# Patient Record
Sex: Female | Born: 1945 | Race: White | Hispanic: No | Marital: Married | Smoking: Never smoker
Health system: Southern US, Community
[De-identification: ages and names within clinical notes are randomized; demographics above are authoritative.]

## PROBLEM LIST (undated history)

## (undated) DIAGNOSIS — I10 Essential (primary) hypertension: Secondary | ICD-10-CM

## (undated) DIAGNOSIS — E119 Type 2 diabetes mellitus without complications: Secondary | ICD-10-CM

## (undated) DIAGNOSIS — C801 Malignant (primary) neoplasm, unspecified: Secondary | ICD-10-CM

## (undated) DIAGNOSIS — E7800 Pure hypercholesterolemia, unspecified: Secondary | ICD-10-CM

## (undated) DIAGNOSIS — R7303 Prediabetes: Secondary | ICD-10-CM

## (undated) HISTORY — PX: ABDOMINAL HYSTERECTOMY: SHX81

## (undated) HISTORY — PX: BREAST SURGERY: SHX581

## (undated) SURGERY — CYSTOSCOPY, WITH STENT INSERTION
Anesthesia: General | Laterality: Left

---

## 2006-11-14 DIAGNOSIS — K5730 Diverticulosis of large intestine without perforation or abscess without bleeding: Secondary | ICD-10-CM | POA: Insufficient documentation

## 2013-09-09 ENCOUNTER — Emergency Department (HOSPITAL_BASED_OUTPATIENT_CLINIC_OR_DEPARTMENT_OTHER)
Admission: EM | Admit: 2013-09-09 | Discharge: 2013-09-10 | Disposition: A | Discharge status: Home / Self Care | Payer: Medicare Other | Attending: Emergency Medicine | Admitting: Emergency Medicine

## 2013-09-09 ENCOUNTER — Encounter (HOSPITAL_BASED_OUTPATIENT_CLINIC_OR_DEPARTMENT_OTHER): Payer: Self-pay | Admitting: Emergency Medicine

## 2013-09-09 ENCOUNTER — Emergency Department (HOSPITAL_BASED_OUTPATIENT_CLINIC_OR_DEPARTMENT_OTHER): Payer: Medicare Other

## 2013-09-09 DIAGNOSIS — Z79899 Other long term (current) drug therapy: Secondary | ICD-10-CM | POA: Insufficient documentation

## 2013-09-09 DIAGNOSIS — Z90710 Acquired absence of both cervix and uterus: Secondary | ICD-10-CM | POA: Insufficient documentation

## 2013-09-09 DIAGNOSIS — I10 Essential (primary) hypertension: Secondary | ICD-10-CM | POA: Insufficient documentation

## 2013-09-09 DIAGNOSIS — R112 Nausea with vomiting, unspecified: Secondary | ICD-10-CM | POA: Diagnosis not present

## 2013-09-09 DIAGNOSIS — R1012 Left upper quadrant pain: Secondary | ICD-10-CM | POA: Insufficient documentation

## 2013-09-09 DIAGNOSIS — R509 Fever, unspecified: Secondary | ICD-10-CM | POA: Diagnosis not present

## 2013-09-09 DIAGNOSIS — E7800 Pure hypercholesterolemia, unspecified: Secondary | ICD-10-CM | POA: Insufficient documentation

## 2013-09-09 HISTORY — DX: Essential (primary) hypertension: I10

## 2013-09-09 HISTORY — DX: Pure hypercholesterolemia, unspecified: E78.00

## 2013-09-09 LAB — COMPREHENSIVE METABOLIC PANEL
ALT: 28 U/L (ref 0–35)
AST: 33 U/L (ref 0–37)
Albumin: 4.4 g/dL (ref 3.5–5.2)
Alkaline Phosphatase: 118 U/L — ABNORMAL HIGH (ref 39–117)
Anion gap: 21 — ABNORMAL HIGH (ref 5–15)
BUN: 19 mg/dL (ref 6–23)
CO2: 23 meq/L (ref 19–32)
CREATININE: 1 mg/dL (ref 0.50–1.10)
Calcium: 10.6 mg/dL — ABNORMAL HIGH (ref 8.4–10.5)
Chloride: 99 mEq/L (ref 96–112)
GFR calc Af Amer: 67 mL/min — ABNORMAL LOW (ref >90)
GFR, EST NON AFRICAN AMERICAN: 57 mL/min — AB (ref >90)
GLUCOSE: 194 mg/dL — AB (ref 70–99)
Potassium: 4.3 mEq/L (ref 3.7–5.3)
Sodium: 143 mEq/L (ref 137–147)
TOTAL PROTEIN: 8.1 g/dL (ref 6.0–8.3)
Total Bilirubin: 0.3 mg/dL (ref 0.3–1.2)

## 2013-09-09 LAB — LIPASE, BLOOD: LIPASE: 43 U/L (ref 11–59)

## 2013-09-09 LAB — URINALYSIS, ROUTINE W REFLEX MICROSCOPIC
Bilirubin Urine: NEGATIVE
Glucose, UA: NEGATIVE mg/dL
Hgb urine dipstick: NEGATIVE
KETONES UR: 15 mg/dL — AB
Leukocytes, UA: NEGATIVE
NITRITE: NEGATIVE
Protein, ur: 30 mg/dL — AB
Specific Gravity, Urine: 1.02 (ref 1.005–1.030)
UROBILINOGEN UA: 0.2 mg/dL (ref 0.0–1.0)
pH: 6 (ref 5.0–8.0)

## 2013-09-09 LAB — CBC WITH DIFFERENTIAL/PLATELET
BASOS ABS: 0 10*3/uL (ref 0.0–0.1)
Basophils Relative: 0 % (ref 0–1)
EOS PCT: 0 % (ref 0–5)
Eosinophils Absolute: 0 10*3/uL (ref 0.0–0.7)
HCT: 49.1 % — ABNORMAL HIGH (ref 36.0–46.0)
Hemoglobin: 16.5 g/dL — ABNORMAL HIGH (ref 12.0–15.0)
LYMPHS ABS: 1.1 10*3/uL (ref 0.7–4.0)
LYMPHS PCT: 6 % — AB (ref 12–46)
MCH: 28.7 pg (ref 26.0–34.0)
MCHC: 33.6 g/dL (ref 30.0–36.0)
MCV: 85.4 fL (ref 78.0–100.0)
MONO ABS: 1.4 10*3/uL — AB (ref 0.1–1.0)
Monocytes Relative: 8 % (ref 3–12)
Neutro Abs: 14.9 10*3/uL — ABNORMAL HIGH (ref 1.7–7.7)
Neutrophils Relative %: 86 % — ABNORMAL HIGH (ref 43–77)
Platelets: 337 10*3/uL (ref 150–400)
RBC: 5.75 MIL/uL — ABNORMAL HIGH (ref 3.87–5.11)
RDW: 14.9 % (ref 11.5–15.5)
WBC: 17.4 10*3/uL — AB (ref 4.0–10.5)

## 2013-09-09 LAB — URINE MICROSCOPIC-ADD ON

## 2013-09-09 MED ORDER — ONDANSETRON 4 MG PO TBDP
4.0000 mg | ORAL_TABLET | Freq: Once | ORAL | Status: AC
  Administered 2013-09-09: 4 mg via ORAL
  Filled 2013-09-09: qty 1

## 2013-09-09 MED ORDER — SODIUM CHLORIDE 0.9 % IV BOLUS (SEPSIS)
1000.0000 mL | Freq: Once | INTRAVENOUS | Status: AC
  Administered 2013-09-09: 1000 mL via INTRAVENOUS

## 2013-09-09 MED ORDER — MORPHINE SULFATE 4 MG/ML IJ SOLN
4.0000 mg | Freq: Once | INTRAMUSCULAR | Status: AC
  Administered 2013-09-09: 4 mg via INTRAVENOUS
  Filled 2013-09-09: qty 1

## 2013-09-09 MED ORDER — ONDANSETRON HCL 4 MG/2ML IJ SOLN: 4.0000 mg | Freq: Once | INTRAMUSCULAR | Status: DC

## 2013-09-09 MED ORDER — IOHEXOL 300 MG/ML  SOLN
50.0000 mL | Freq: Once | INTRAMUSCULAR | Status: AC | PRN
  Administered 2013-09-09: 50 mL via ORAL

## 2013-09-09 MED ORDER — ONDANSETRON HCL 4 MG/2ML IJ SOLN
4.0000 mg | Freq: Once | INTRAMUSCULAR | Status: AC
  Administered 2013-09-09: 4 mg via INTRAVENOUS
  Filled 2013-09-09: qty 2

## 2013-09-09 MED ORDER — IOHEXOL 300 MG/ML  SOLN
100.0000 mL | Freq: Once | INTRAMUSCULAR | Status: AC | PRN
  Administered 2013-09-09: 100 mL via INTRAVENOUS

## 2013-09-09 NOTE — ED Provider Notes (Signed)
CSN: [id number]     Arrival date & time 09/09/13  2044 History  This chart was scribed for Cheryl Patches, MD by Jeanell Sparrow, ED Scribe. This patient was seen in room MH01/MH01 and the patient's care was started at 9:30 PM.   Chief Complaint  Patient presents with  . Emesis   Patient is a 68 y.o. female presenting with vomiting. The history is provided by the patient and the spouse. No language interpreter was used.  Emesis Severity:  Moderate Duration:  10 hours Timing:  Intermittent Number of daily episodes:  12 Progression:  Unchanged Chronicity:  New Context: not self-induced   Relieved by:  None tried Worsened by:  Nothing tried Ineffective treatments:  None tried Associated symptoms: abdominal pain and fever   Associated symptoms: no arthralgias, no chills, no diarrhea, no headaches and no sore throat   Risk factors: prior abdominal surgery    HPI Comments: Cheryl Stevens is a 68 y.o. female who presents to the Emergency Department complaining of moderate intermittent emesis that started 9 hours ago. She states that 2 days she started to feel nauseous and last night she had constant moderate cramping abdominal pain. He reports that she ate salmon for lunch and had emesis afterwards. She reports about 12 episodes of emesis with no blood. She states that she also had associated fever. She states that her pain is not like when she had kidney stones in 2010. She reports that she has a hx of a hysterectomy and appendectomy. She states that her last BM was this morning and it was normal. She denies any diarrhea or dysuria.    PCP [place] at [facility]   Past Medical History  Diagnosis Date  . Hypertension   . High cholesterol    Past Surgical History  Procedure Laterality Date  . Abdominal hysterectomy     No family history on file. History  Substance Use Topics  . Smoking status: Never Smoker   . Smokeless tobacco: Not on file  . Alcohol Use: Yes     Comment: social    OB History   Grav Para Term Preterm Abortions TAB SAB Ect Mult Living                 Review of Systems  Constitutional: Positive for fever. Negative for chills, diaphoresis, activity change, appetite change and fatigue.  HENT: Negative for congestion, facial swelling, rhinorrhea and sore throat.   Eyes: Negative for photophobia and discharge.  Respiratory: Negative for cough, chest tightness and shortness of breath.   Cardiovascular: Negative for chest pain, palpitations and leg swelling.  Gastrointestinal: Positive for nausea, vomiting and abdominal pain. Negative for diarrhea.  Endocrine: Negative for polydipsia and polyuria.  Genitourinary: Negative for dysuria, frequency, difficulty urinating and pelvic pain.  Musculoskeletal: Negative for arthralgias, back pain, neck pain and neck stiffness.  Skin: Negative for color change and wound.  Allergic/Immunologic: Negative for immunocompromised state.  Neurological: Negative for facial asymmetry, weakness, numbness and headaches.  Hematological: Does not bruise/bleed easily.  Psychiatric/Behavioral: Negative for confusion and agitation.    Allergies  Review of patient's allergies indicates no known allergies.  Home Medications   Prior to Admission medications   Medication Sig Start Date End Date Taking? Authorizing Provider  amLODipine (NORVASC) 2.5 MG tablet Take 2.5 mg by mouth daily.   Yes Historical Provider, MD  rosuvastatin (CRESTOR) 10 MG tablet Take 10 mg by mouth daily.   Yes Historical Provider, MD  HYDROcodone-acetaminophen ([place]) 5-325 MG per  tablet Take 1 tablet by mouth every 6 (six) hours as needed. 09/10/13   Cheryl Patches, MD  ondansetron (ZOFRAN) 4 MG tablet Take 1 tablet (4 mg total) by mouth every 6 (six) hours. 09/10/13   Cheryl Patches, MD   BP 129/79  Pulse 100  Temp(Src) 97.6 F (36.4 C) (Oral)  Resp 18  Ht 5\' 6"  (1.676 m)  Wt 190 lb (86.183 kg)  BMI 30.68 kg/m2  SpO2 95% Physical Exam  Nursing  note and vitals reviewed. Constitutional: She is oriented to person, place, and time. She appears well-developed and well-nourished. No distress.  HENT:  Head: Normocephalic.  Mouth/Throat: Oropharynx is clear and moist.  Eyes: Pupils are equal, round, and reactive to light.  Neck: Neck supple.  Cardiovascular: Normal rate, regular rhythm and normal heart sounds.   Pulmonary/Chest: Effort normal and breath sounds normal. No respiratory distress. She has no wheezes.  Abdominal: Soft. She exhibits no distension. There is tenderness. There is no rebound and no guarding.  Supra pubic and LLQ pain.   Musculoskeletal: She exhibits no edema and no tenderness.  Neurological: She is alert and oriented to person, place, and time.  Skin: Skin is warm and dry.  Psychiatric: She has a normal mood and affect.    ED Course  Procedures (including critical care time) DIAGNOSTIC STUDIES: Oxygen Saturation is 95% on RA, normal by my interpretation.    COORDINATION OF CARE: 9:34 PM- Pt advised of plan for treatment which includes medication and labs and pt agrees.  Labs Review Labs Reviewed  CBC WITH DIFFERENTIAL - Abnormal; Notable for the following:    WBC 17.4 (*)    RBC 5.75 (*)    Hemoglobin 16.5 (*)    HCT 49.1 (*)    Neutrophils Relative % 86 (*)    Neutro Abs 14.9 (*)    Lymphocytes Relative 6 (*)    Monocytes Absolute 1.4 (*)    All other components within normal limits  COMPREHENSIVE METABOLIC PANEL - Abnormal; Notable for the following:    Glucose, Bld 194 (*)    Calcium 10.6 (*)    Alkaline Phosphatase 118 (*)    GFR calc non Af Amer 57 (*)    GFR calc Af Amer 67 (*)    Anion gap 21 (*)    All other components within normal limits  URINALYSIS, ROUTINE W REFLEX MICROSCOPIC - Abnormal; Notable for the following:    APPearance CLOUDY (*)    Ketones, ur 15 (*)    Protein, ur 30 (*)    All other components within normal limits  URINE MICROSCOPIC-ADD ON - Abnormal; Notable for the  following:    Squamous Epithelial / LPF FEW (*)    All other components within normal limits  URINE CULTURE  LIPASE, BLOOD    Imaging Review No results found.   EKG Interpretation None      MDM   Final diagnoses:  Left upper quadrant pain  Non-intractable vomiting with nausea, vomiting of unspecified type    Pt is a 68 y.o. female with Pmhx as above who presents with LLQ pain, perfuse n/v, since yesterday. Symptoms not improved after initial treatment and CT ab/pelvis ordered to t/o intraabdominal infection. Colon filled with fluid. Suspect early gastroenteritis vs food borne illness. Will d/c home w/ instructions for supportive care with zofran, norco for pain. Return precautions given for new or worsening symptoms including worsening pain, fever, inability to tolerate PO>      I personally performed  the services described in this documentation, which was scribed in my presence. The recorded information has been reviewed and is accurate.      Cheryl Patches, MD 09/16/13 [phone]

## 2013-09-09 NOTE — ED Notes (Signed)
Pt states she thinks she has food poisoning, vomiting since yesterday; states ate salmon for lunch but still vomiting;

## 2013-09-09 NOTE — ED Notes (Signed)
Vomiting started this pm after lunch, abd pain, denies urinary sx  No diarrhea

## 2013-09-09 NOTE — ED Notes (Signed)
Pt return from CT, c/o pain. Pt medicated per MD orders.

## 2013-09-10 DIAGNOSIS — R112 Nausea with vomiting, unspecified: Secondary | ICD-10-CM | POA: Diagnosis not present

## 2013-09-10 LAB — URINE CULTURE
Colony Count: 60000
Special Requests: NORMAL

## 2013-09-10 MED ORDER — HYDROCODONE-ACETAMINOPHEN 5-325 MG PO TABS: 1.0000 | ORAL_TABLET | Freq: Four times a day (QID) | ORAL | Status: DC | PRN

## 2013-09-10 MED ORDER — ONDANSETRON HCL 4 MG/2ML IJ SOLN
4.0000 mg | Freq: Once | INTRAMUSCULAR | Status: AC
  Administered 2013-09-10: 4 mg via INTRAVENOUS
  Filled 2013-09-10: qty 2

## 2013-09-10 MED ORDER — ONDANSETRON HCL 4 MG PO TABS: 4.0000 mg | ORAL_TABLET | Freq: Four times a day (QID) | ORAL | Status: DC

## 2013-09-10 NOTE — Discharge Instructions (Signed)

## 2013-09-10 NOTE — ED Provider Notes (Signed)
Pt taking PO, feels improved, she is in no distress Stable for d/c home   Sharyon Cable, MD 09/10/13 0121

## 2015-04-23 ENCOUNTER — Inpatient Hospital Stay (HOSPITAL_BASED_OUTPATIENT_CLINIC_OR_DEPARTMENT_OTHER)
Admission: EM | Admit: 2015-04-23 | Discharge: 2015-04-25 | DRG: 392 | Disposition: A | Discharge status: Home / Self Care | Payer: Medicare Other | Attending: Internal Medicine | Admitting: Internal Medicine

## 2015-04-23 ENCOUNTER — Encounter (HOSPITAL_BASED_OUTPATIENT_CLINIC_OR_DEPARTMENT_OTHER): Payer: Self-pay

## 2015-04-23 ENCOUNTER — Emergency Department (HOSPITAL_BASED_OUTPATIENT_CLINIC_OR_DEPARTMENT_OTHER): Payer: Medicare Other

## 2015-04-23 DIAGNOSIS — K529 Noninfective gastroenteritis and colitis, unspecified: Principal | ICD-10-CM | POA: Diagnosis present

## 2015-04-23 DIAGNOSIS — I10 Essential (primary) hypertension: Secondary | ICD-10-CM | POA: Diagnosis present

## 2015-04-23 DIAGNOSIS — R109 Unspecified abdominal pain: Secondary | ICD-10-CM | POA: Diagnosis not present

## 2015-04-23 DIAGNOSIS — E860 Dehydration: Secondary | ICD-10-CM | POA: Diagnosis present

## 2015-04-23 DIAGNOSIS — E119 Type 2 diabetes mellitus without complications: Secondary | ICD-10-CM | POA: Diagnosis present

## 2015-04-23 DIAGNOSIS — R112 Nausea with vomiting, unspecified: Secondary | ICD-10-CM | POA: Insufficient documentation

## 2015-04-23 DIAGNOSIS — E7800 Pure hypercholesterolemia, unspecified: Secondary | ICD-10-CM | POA: Diagnosis present

## 2015-04-23 LAB — CBC
HCT: 39.4 % (ref 36.0–46.0)
Hemoglobin: 13.3 g/dL (ref 12.0–15.0)
MCH: 28.1 pg (ref 26.0–34.0)
MCHC: 33.8 g/dL (ref 30.0–36.0)
MCV: 83.3 fL (ref 78.0–100.0)
PLATELETS: 261 10*3/uL (ref 150–400)
RBC: 4.73 MIL/uL (ref 3.87–5.11)
RDW: 14 % (ref 11.5–15.5)
WBC: 12.1 10*3/uL — AB (ref 4.0–10.5)

## 2015-04-23 LAB — CBC WITH DIFFERENTIAL/PLATELET
BASOS PCT: 0 %
Basophils Absolute: 0.1 10*3/uL (ref 0.0–0.1)
EOS ABS: 0.1 10*3/uL (ref 0.0–0.7)
EOS PCT: 0 %
HCT: 47.1 % — ABNORMAL HIGH (ref 36.0–46.0)
HEMOGLOBIN: 16.1 g/dL — AB (ref 12.0–15.0)
LYMPHS ABS: 2.3 10*3/uL (ref 0.7–4.0)
Lymphocytes Relative: 15 %
MCH: 28.9 pg (ref 26.0–34.0)
MCHC: 34.2 g/dL (ref 30.0–36.0)
MCV: 84.4 fL (ref 78.0–100.0)
Monocytes Absolute: 0.6 10*3/uL (ref 0.1–1.0)
Monocytes Relative: 4 %
NEUTROS ABS: 11.9 10*3/uL — AB (ref 1.7–7.7)
NEUTROS PCT: 81 %
PLATELETS: 256 10*3/uL (ref 150–400)
RBC: 5.58 MIL/uL — ABNORMAL HIGH (ref 3.87–5.11)
RDW: 14.5 % (ref 11.5–15.5)
WBC: 14.9 10*3/uL — ABNORMAL HIGH (ref 4.0–10.5)

## 2015-04-23 LAB — COMPREHENSIVE METABOLIC PANEL
ALT: 24 U/L (ref 14–54)
ANION GAP: 13 (ref 5–15)
AST: 33 U/L (ref 15–41)
Albumin: 4.8 g/dL (ref 3.5–5.0)
Alkaline Phosphatase: 123 U/L (ref 38–126)
BUN: 13 mg/dL (ref 6–20)
CHLORIDE: 107 mmol/L (ref 101–111)
CO2: 20 mmol/L — ABNORMAL LOW (ref 22–32)
Calcium: 10.1 mg/dL (ref 8.9–10.3)
Creatinine, Ser: 0.81 mg/dL (ref 0.44–1.00)
GFR calc non Af Amer: >60 mL/min (ref >60)
Glucose, Bld: 203 mg/dL — ABNORMAL HIGH (ref 65–99)
Potassium: 4.1 mmol/L (ref 3.5–5.1)
SODIUM: 140 mmol/L (ref 135–145)
Total Bilirubin: 0.6 mg/dL (ref 0.3–1.2)
Total Protein: 8.4 g/dL — ABNORMAL HIGH (ref 6.5–8.1)

## 2015-04-23 LAB — LIPASE, BLOOD: LIPASE: 55 U/L — AB (ref 11–51)

## 2015-04-23 LAB — CREATININE, SERUM
CREATININE: 0.66 mg/dL (ref 0.44–1.00)
GFR calc non Af Amer: >60 mL/min (ref >60)

## 2015-04-23 MED ORDER — IOPAMIDOL (ISOVUE-300) INJECTION 61%
100.0000 mL | Freq: Once | INTRAVENOUS | Status: AC | PRN
  Administered 2015-04-23: 100 mL via INTRAVENOUS

## 2015-04-23 MED ORDER — ONDANSETRON HCL 4 MG/2ML IJ SOLN: 4.0000 mg | Freq: Four times a day (QID) | INTRAMUSCULAR | Status: DC | PRN

## 2015-04-23 MED ORDER — IOPAMIDOL (ISOVUE-370) INJECTION 76%: 100.0000 mL | Freq: Once | INTRAVENOUS | Status: DC | PRN

## 2015-04-23 MED ORDER — KETOROLAC TROMETHAMINE 30 MG/ML IJ SOLN
30.0000 mg | Freq: Once | INTRAMUSCULAR | Status: AC
  Administered 2015-04-23: 30 mg via INTRAVENOUS
  Filled 2015-04-23: qty 1

## 2015-04-23 MED ORDER — ONDANSETRON HCL 4 MG/2ML IJ SOLN
4.0000 mg | Freq: Once | INTRAMUSCULAR | Status: AC
  Administered 2015-04-23: 4 mg via INTRAVENOUS

## 2015-04-23 MED ORDER — MORPHINE SULFATE (PF) 4 MG/ML IV SOLN
4.0000 mg | Freq: Once | INTRAVENOUS | Status: AC
  Administered 2015-04-23: 4 mg via INTRAVENOUS
  Filled 2015-04-23: qty 1

## 2015-04-23 MED ORDER — MORPHINE SULFATE (PF) 2 MG/ML IV SOLN
2.0000 mg | Freq: Once | INTRAVENOUS | Status: AC
  Administered 2015-04-23: 2 mg via INTRAVENOUS
  Filled 2015-04-23: qty 1

## 2015-04-23 MED ORDER — ONDANSETRON HCL 4 MG PO TABS: 4.0000 mg | ORAL_TABLET | Freq: Four times a day (QID) | ORAL | Status: DC | PRN

## 2015-04-23 MED ORDER — CIPROFLOXACIN IN D5W 400 MG/200ML IV SOLN
400.0000 mg | Freq: Two times a day (BID) | INTRAVENOUS | Status: DC
  Administered 2015-04-23 – 2015-04-25 (×5): 400 mg via INTRAVENOUS
  Filled 2015-04-23 (×6): qty 200

## 2015-04-23 MED ORDER — MORPHINE SULFATE (PF) 2 MG/ML IV SOLN
2.0000 mg | INTRAVENOUS | Status: DC | PRN
  Administered 2015-04-23 – 2015-04-24 (×5): 2 mg via INTRAVENOUS
  Filled 2015-04-23 (×5): qty 1

## 2015-04-23 MED ORDER — PANTOPRAZOLE SODIUM 40 MG IV SOLR
40.0000 mg | Freq: Once | INTRAVENOUS | Status: AC
  Administered 2015-04-23: 40 mg via INTRAVENOUS
  Filled 2015-04-23: qty 40

## 2015-04-23 MED ORDER — GLIPIZIDE ER 5 MG PO TB24
5.0000 mg | ORAL_TABLET | Freq: Every day | ORAL | Status: DC
  Administered 2015-04-24 – 2015-04-25 (×2): 5 mg via ORAL
  Filled 2015-04-23 (×3): qty 1

## 2015-04-23 MED ORDER — ONDANSETRON HCL 4 MG/2ML IJ SOLN
4.0000 mg | Freq: Four times a day (QID) | INTRAMUSCULAR | Status: DC | PRN
  Administered 2015-04-23: 4 mg via INTRAVENOUS
  Filled 2015-04-23: qty 2

## 2015-04-23 MED ORDER — ENOXAPARIN SODIUM 40 MG/0.4ML ~~LOC~~ SOLN
40.0000 mg | SUBCUTANEOUS | Status: DC
  Administered 2015-04-23 – 2015-04-25 (×3): 40 mg via SUBCUTANEOUS
  Filled 2015-04-23 (×3): qty 0.4

## 2015-04-23 MED ORDER — ONDANSETRON HCL 4 MG/2ML IJ SOLN
INTRAMUSCULAR | Status: AC
  Filled 2015-04-23: qty 2

## 2015-04-23 MED ORDER — AMLODIPINE BESYLATE 2.5 MG PO TABS
2.5000 mg | ORAL_TABLET | Freq: Every day | ORAL | Status: DC
  Administered 2015-04-23 – 2015-04-25 (×3): 2.5 mg via ORAL
  Filled 2015-04-23 (×3): qty 1

## 2015-04-23 MED ORDER — METRONIDAZOLE IN NACL 5-0.79 MG/ML-% IV SOLN
500.0000 mg | Freq: Once | INTRAVENOUS | Status: AC
  Administered 2015-04-23: 500 mg via INTRAVENOUS
  Filled 2015-04-23: qty 100

## 2015-04-23 MED ORDER — GABAPENTIN 300 MG PO CAPS
300.0000 mg | ORAL_CAPSULE | Freq: Every day | ORAL | Status: DC
  Administered 2015-04-23 – 2015-04-24 (×2): 300 mg via ORAL
  Filled 2015-04-23 (×3): qty 1

## 2015-04-23 MED ORDER — SODIUM CHLORIDE 0.9 % IV SOLN
INTRAVENOUS | Status: DC
  Administered 2015-04-23 – 2015-04-25 (×3): via INTRAVENOUS

## 2015-04-23 MED ORDER — SODIUM CHLORIDE 0.9 % IV BOLUS (SEPSIS)
1000.0000 mL | Freq: Once | INTRAVENOUS | Status: AC
  Administered 2015-04-23: 1000 mL via INTRAVENOUS

## 2015-04-23 MED ORDER — GABAPENTIN 100 MG PO CAPS
100.0000 mg | ORAL_CAPSULE | Freq: Every morning | ORAL | Status: DC
  Administered 2015-04-23 – 2015-04-25 (×3): 100 mg via ORAL
  Filled 2015-04-23 (×3): qty 1

## 2015-04-23 MED ORDER — METRONIDAZOLE IN NACL 5-0.79 MG/ML-% IV SOLN
500.0000 mg | Freq: Three times a day (TID) | INTRAVENOUS | Status: DC
  Administered 2015-04-23 – 2015-04-25 (×7): 500 mg via INTRAVENOUS
  Filled 2015-04-23 (×8): qty 100

## 2015-04-23 MED ORDER — SENNOSIDES-DOCUSATE SODIUM 8.6-50 MG PO TABS: 1.0000 | ORAL_TABLET | Freq: Every evening | ORAL | Status: DC | PRN

## 2015-04-23 NOTE — Progress Notes (Signed)
  Cheryl Stevens is a 70 year old female with past medical history significant for HTN and HLD; who presented with complaints of abdominal pain, nausea, and vomiting. Initial lab work WBC 14.9, hemoglobin 16.1, glucose 203, lipase 55, total protein 8.4. CT scan of the abdomen revealed long segment of mid to distal ileum at the right lower quadrant, with mild wall thickening along the more distal small bowel. No small bowel obstruction seen. Question for possible ileitis. Vitals otherwise stable. Given metronidazole IV. Transferring from [facility] to a MedSurg bed at [facility].

## 2015-04-23 NOTE — ED Provider Notes (Signed)
CSN: [id number]     Arrival date & time 04/23/15  0153 History   First MD Initiated Contact with Patient 04/23/15 0211     Chief Complaint  Patient presents with  . Emesis     (Consider location/radiation/quality/duration/timing/severity/associated sxs/prior Treatment) HPI Comments: Patient is 70 year old female with history of hypertension and hypercholesterolemia. She presents for evaluation of nausea, vomiting. She reports not feeling well earlier today, then this evening began to vomit and cannot stop. She denies any diarrhea. She denies any abdominal pain. She denies any ill contacts or fever.  Patient is a 70 y.o. female presenting with vomiting. The history is provided by the patient.  Emesis Severity:  Moderate Duration:  4 hours Timing:  Constant Progression:  Worsening Chronicity:  New Recent urination:  Normal Relieved by:  Nothing Worsened by:  Nothing tried Ineffective treatments:  None tried Associated symptoms: no abdominal pain, no chills and no fever     Past Medical History  Diagnosis Date  . Hypertension   . High cholesterol    Past Surgical History  Procedure Laterality Date  . Abdominal hysterectomy     No family history on file. Social History  Substance Use Topics  . Smoking status: Never Smoker   . Smokeless tobacco: None  . Alcohol Use: Yes     Comment: social   OB History    No data available     Review of Systems  Constitutional: Negative for chills.  Gastrointestinal: Positive for vomiting. Negative for abdominal pain.  All other systems reviewed and are negative.     Allergies  Review of patient's allergies indicates no known allergies.  Home Medications   Prior to Admission medications   Medication Sig Start Date End Date Taking? Authorizing Provider  amLODipine (NORVASC) 2.5 MG tablet Take 2.5 mg by mouth daily.    Historical Provider, MD  HYDROcodone-acetaminophen (NORCO) 5-325 MG per tablet Take 1 tablet by mouth every 6  (six) hours as needed. 09/10/13   Ernestina Patches, MD  ondansetron (ZOFRAN) 4 MG tablet Take 1 tablet (4 mg total) by mouth every 6 (six) hours. 09/10/13   Ernestina Patches, MD  rosuvastatin (CRESTOR) 10 MG tablet Take 10 mg by mouth daily.    Historical Provider, MD   BP 158/79 mmHg  Pulse 98  Temp(Src) 98.2 F (36.8 C) (Oral)  Resp 20  Ht 5\' 6"  (1.676 m)  Wt 190 lb (86.183 kg)  BMI 30.68 kg/m2  SpO2 95% Physical Exam  Constitutional: She is oriented to person, place, and time. She appears well-developed and well-nourished. No distress.  HENT:  Head: Normocephalic and atraumatic.  Mouth/Throat: Oropharynx is clear and moist.  Neck: Normal range of motion. Neck supple.  Cardiovascular: Normal rate and regular rhythm.  Exam reveals no gallop and no friction rub.   No murmur heard. Pulmonary/Chest: Effort normal and breath sounds normal. No respiratory distress. She has no wheezes.  Abdominal: Soft. Bowel sounds are normal. She exhibits no distension. There is no tenderness.  Musculoskeletal: Normal range of motion.  Neurological: She is alert and oriented to person, place, and time.  Skin: Skin is warm and dry. She is not diaphoretic.  Nursing note and vitals reviewed.   ED Course  Procedures (including critical care time) Labs Review Labs Reviewed  COMPREHENSIVE METABOLIC PANEL  CBC WITH DIFFERENTIAL/PLATELET  LIPASE, BLOOD    Imaging Review No results found. I have personally reviewed and evaluated these images and lab results as part of my medical decision-making.  MDM   Final diagnoses:  None    Workup reveals fecal is age and of a segment of small bowel and findings consistent with an ileitis. She continues to complain of severe abdominal cramping and my concern is that this may represent an early small bowel obstruction. I spoke with Dr. Tamala Julian from the hospitalist service who agrees to admit to [facility]. The patient may require surgical consultation if her  condition does not improve in the near future.    Veryl Speak, MD 04/23/15 [phone]

## 2015-04-23 NOTE — ED Notes (Signed)
Pt c/o not feeling well all day Friday, started vomiting around 8p, c/o unable to keep anything down and having lower abdominal pain

## 2015-04-23 NOTE — H&P (Addendum)
[facility] History and Physical  Cheryl Stevens [id number] DOB: Dec 30, 1945 DOA: 04/23/2015  Referring physician: Dr Stark Jock at [facility] [place] [facility] [place]  PCP: Tempie Hoist, MD   Chief Complaint: Abdominal pain, intractable nausea and vomiting of 12-14 hour duration prior to arrival at [facility] [place] overnight on 3-31  HPI: Cheryl Stevens is a 70 y.o. female with a past medical history significant for hypertension, diabetes, dyslipidemia. 2 years ago, patient was evaluated at emergency [facility] for abdominal pain nausea vomiting, essentially same symptoms. At that time she was diagnosed with possibly viral gastroenteritis and was discharged home after evaluation from the emergency department for a few hours.  Patient has a surgical history significant for hysterectomy and appendectomy. Patient states that she has been having abdominal discomfort, cramping squeezing type pain essentially localized to bilateral lower quadrants and suprapubic area. This has been going on for the last 12-18 hours. Additionally, she had severe intractable nausea and vomiting. She could not keep anything down. Patient denies any history of blood loss in her stools. Denies any history of constipation. Last bowel movement was 3-31. Patient does not have a history of inflammatory bowel disease. Her last colonoscopy was 2 years ago. She has colonoscopies every 5 years because of a noncancerous polyp diagnosed on her last colonoscopy. Her gastroenterologist is at [facility]. Patient denies any fevers or chills. Denies any diarrhea.  She presented herself to [facility]. Abdominal imaging over there revealed a fecalization of a long segment mid to distal ileum at right lower quadrant. Mild wall thickening also noted along distal small bowel. No evidence of small bowel obstruction. Workup at [facility] also positive for leukocytosis of 14.9, hemoglobin 16.1 likely  representing contraction/dehydration, elevated lipase also likely reflective for nausea vomiting.  Patient is a very pleasant lady is resting in bed. She received regular breakfast or what was only able to drink ginger ale off of that tray. She still complains of some mild abdominal bilateral lower quadrant cramping going on. Currently, she does not feel nauseous has not vomited since arrival from [facility].  Hospitalist admission continues  Review of Systems:  Constitutional:  No weight loss, night sweats, Fevers, chills, fatigue.  HEENT:  No headaches, Difficulty swallowing,Tooth/dental problems,Sore throat,  No sneezing, itching, ear ache, nasal congestion, post nasal drip,  Cardio-vascular:  No chest pain, Orthopnea, PND, swelling in lower extremities, anasarca, dizziness, palpitations  GI:  Positive for acute abdominal pain bilateral lower quadrant for the last 12-14 hrs. Resp:  No shortness of breath with exertion or at rest. No excess mucus, no productive cough, No non-productive cough, No coughing up of blood.No change in color of mucus.No wheezing.No chest wall deformity  Skin:  no rash or lesions.  GU:  no dysuria, change in color of urine, no urgency or frequency. No flank pain.  Musculoskeletal:  No joint pain or swelling. No decreased range of motion. No back pain.  Psych:  No change in mood or affect. No depression or anxiety. No memory loss.   Past Medical History  Diagnosis Date  . Hypertension   . High cholesterol    Past Surgical History  Procedure Laterality Date  . Abdominal hysterectomy     Social History:  reports that she has never smoked. She does not have any smokeless tobacco history on file. She reports that she drinks alcohol. Her drug history is not on file. Lives at home with her husband in [place], [place]. Does not have any children. No Known Allergies  No family history on file.  Patient's parents are deceased. Mother died  of cerebrovascular accident. Father died in a motor vehicle accident.  Prior to Admission medications   Medication Sig Start Date End Date Taking? Authorizing Provider  amLODipine (NORVASC) 2.5 MG tablet Take 2.5 mg by mouth daily.    Historical Provider, MD  HYDROcodone-acetaminophen (NORCO) 5-325 MG per tablet Take 1 tablet by mouth every 6 (six) hours as needed. 09/10/13   Ernestina Patches, MD  ondansetron (ZOFRAN) 4 MG tablet Take 1 tablet (4 mg total) by mouth every 6 (six) hours. 09/10/13   Ernestina Patches, MD  rosuvastatin (CRESTOR) 10 MG tablet Take 10 mg by mouth daily.    Historical Provider, MD   Physical Exam: Filed Vitals:   04/23/15 0203 04/23/15 0617 04/23/15 0710 04/23/15 0821  BP: 158/79 142/77 152/72 153/53  Pulse: 98 88 83 79  Temp: 98.2 F (36.8 C) 97.9 F (36.6 C) 98.2 F (36.8 C) 98 F (36.7 C)  TempSrc: Oral Oral Oral Oral  Resp: 20     Height: 5\' 6"  (1.676 m)     Weight: 86.183 kg (190 lb)     SpO2: 95% 98% 97% 95%    Wt Readings from Last 3 Encounters:  04/23/15 86.183 kg (190 lb)  09/09/13 86.183 kg (190 lb)    General:  Appears calm and comfortable Eyes: PERRL, normal lids, irises & conjunctiva ENT: grossly normal hearing, lips & tongue Neck: no LAD, masses or thyromegaly Cardiovascular: RRR, no m/r/g. No LE edema. Telemetry: SR, no arrhythmias  Respiratory: CTA bilaterally, no w/r/r. Normal respiratory effort. Abdomen: soft, mild distention generalized. Right lower quadrant pain, suprapubic pain to deep palpation. No rigidity. Bowel sounds appreciated. Skin: no rash or induration seen on limited exam Musculoskeletal: grossly normal tone BUE/BLE Psychiatric: grossly normal mood and affect, speech fluent and appropriate Neurologic: grossly non-focal.          Labs on Admission:  Basic Metabolic Panel:  Recent Labs Lab 04/23/15 0215  NA 140  K 4.1  CL 107  CO2 20*  GLUCOSE 203*  BUN 13  CREATININE 0.81  CALCIUM 10.1   Liver Function  Tests:  Recent Labs Lab 04/23/15 0215  AST 33  ALT 24  ALKPHOS 123  BILITOT 0.6  PROT 8.4*  ALBUMIN 4.8    Recent Labs Lab 04/23/15 0215  LIPASE 55*   No results for input(s): AMMONIA in the last 168 hours. CBC:  Recent Labs Lab 04/23/15 0215  WBC 14.9*  NEUTROABS 11.9*  HGB 16.1*  HCT 47.1*  MCV 84.4  PLT 256   Cardiac Enzymes: No results for input(s): CKTOTAL, CKMB, CKMBINDEX, TROPONINI in the last 168 hours.  BNP (last 3 results) No results for input(s): BNP in the last 8760 hours.  ProBNP (last 3 results) No results for input(s): PROBNP in the last 8760 hours.  CBG: No results for input(s): GLUCAP in the last 168 hours.  Radiological Exams on Admission: Ct Abdomen Pelvis W Contrast  04/23/2015  CLINICAL DATA:  Acute onset of vomiting and leukocytosis. Lower abdominal pain. Initial encounter. EXAM: CT ABDOMEN AND PELVIS WITH CONTRAST TECHNIQUE: Multidetector CT imaging of the abdomen and pelvis was performed using the standard protocol following bolus administration of intravenous contrast. CONTRAST:  176mL ISOVUE-300 IOPAMIDOL (ISOVUE-300) INJECTION 61% COMPARISON:  CT of the abdomen and pelvis from 09/09/2013 FINDINGS: Minimal bibasilar atelectasis is noted. A 1.5 cm hypodensity is  noted within the left hepatic lobe. The liver and spleen are otherwise unremarkable. The gallbladder is within normal limits. The pancreas and adrenal glands are unremarkable. A few tiny bilateral renal cysts are noted. A 5 mm stone is noted at the interpole region of the left kidney. There is no evidence of hydronephrosis. No obstructing ureteral stones are seen. No perinephric stranding is appreciated. There is fecalization of a long segment of mid to distal ileum at the right lower quadrant, with mild wall thickening along the more distal small bowel. This may reflect small bowel dysmotility due to mild infectious or inflammatory ileitis, or possibly an adhesion. Mild associated soft  tissue inflammation is seen. There is no evidence of significant small bowel obstruction. The stomach is within normal limits. No acute vascular abnormalities are seen. Scattered calcification is noted along the abdominal aorta and its branches. The appendix is not definitely characterized; there is no evidence of appendicitis. The cecum is noted at the right hemipelvis. Trace fluid adjacent to the cecum is thought to be physiologic in nature. Scattered diverticulosis is noted along the entirety of the colon, without evidence of diverticulitis. The bladder is mildly distended and grossly unremarkable. The patient is status post hysterectomy. No suspicious adnexal masses are seen. No inguinal lymphadenopathy is seen. No acute osseous abnormalities are identified. Degenerative change is noted at both hip joints, with subcortical cystic change and cortical irregularity. 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 Vacuum phenomenon is noted at L4-L5, with mild underlying facet disease. IMPRESSION: 1. Fecalization of a long segment of mid to distal ileum at the right lower quadrant, with mild wall thickening along the more distal small bowel. This may reflect small bowel dysmotility due to mild infectious or inflammatory ileitis, or possibly secondary to an underlying adhesion. Mild associated soft tissue inflammation. No evidence of significant small bowel obstruction. 2. Scattered diverticulosis along the entirety of the colon, without evidence of diverticulitis. 3. Small amount of free fluid within the pelvis is thought to be physiologic in nature. 4. Few tiny bilateral renal cysts seen. 5 mm nonobstructing stone at the interpole region of the left kidney. 5. 1.5 cm nonspecific hypodensity within the left hepatic lobe. This has increased mildly in size from 2015, but likely reflects a cyst. 6. Scattered calcification along the abdominal aorta and its branches. 7. Subcortical  cystic change and cortical irregularity at both hips. 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 Electronically Signed   By: Garald Balding M.D.   On: 04/23/2015 04:34    EKG: Independently reviewed.   Assessment/Plan Active Problems:   Abdominal pain   Ileitis   1. Acute abdominal pain, nausea vomiting likely secondary to mild infectious or inflammatory ileitis, or possibly secondary to an underlying adhesion.  Gentle IV fluid hydration, IV ciprofloxacin, IV Flagyl. Clear liquid diet When necessary morphine for abdominal pain. When necessary Zofran for nausea vomiting. When necessary bowel regimen. Repeat blood work CBC, metabolic panel in morning.  Hopefully this is a self-limiting situation, able to be resolved soon with above measures. Consider surgical consultation if the patient has acute intractable pain or worsening nausea vomiting or worsening labs. Check urinalysis to rule out urinary tract infection.  She is asking about probiotics. Discussed about recent literature regarding use or limitation of probiotics. Advised her to discuss with her primary care physician or her gastroenterologist after discharge.  Code Status: Full code DVT Prophylaxis: Lovenox Family Communication: No family present  at the bedside. Patient lives at home with her husband. Disposition Plan: -2 days  Time spent: 90 min   [facility] Pager [phone]  7A- 7 PM Overnight, check [email or web address], password [email or web address]

## 2015-04-24 DIAGNOSIS — R112 Nausea with vomiting, unspecified: Secondary | ICD-10-CM

## 2015-04-24 DIAGNOSIS — K529 Noninfective gastroenteritis and colitis, unspecified: Principal | ICD-10-CM

## 2015-04-24 LAB — COMPREHENSIVE METABOLIC PANEL
ALT: 18 U/L (ref 14–54)
ANION GAP: 7 (ref 5–15)
AST: 24 U/L (ref 15–41)
Albumin: 3.5 g/dL (ref 3.5–5.0)
Alkaline Phosphatase: 84 U/L (ref 38–126)
BILIRUBIN TOTAL: 0.6 mg/dL (ref 0.3–1.2)
BUN: 9 mg/dL (ref 6–20)
CALCIUM: 8.6 mg/dL — AB (ref 8.9–10.3)
CO2: 26 mmol/L (ref 22–32)
Chloride: 112 mmol/L — ABNORMAL HIGH (ref 101–111)
Creatinine, Ser: 0.6 mg/dL (ref 0.44–1.00)
GFR calc Af Amer: >60 mL/min (ref >60)
Glucose, Bld: 99 mg/dL (ref 65–99)
POTASSIUM: 3.9 mmol/L (ref 3.5–5.1)
Sodium: 145 mmol/L (ref 135–145)
Total Protein: 5.9 g/dL — ABNORMAL LOW (ref 6.5–8.1)

## 2015-04-24 LAB — CBC
HEMATOCRIT: 39.9 % (ref 36.0–46.0)
HEMOGLOBIN: 13.1 g/dL (ref 12.0–15.0)
MCH: 28.4 pg (ref 26.0–34.0)
MCHC: 32.8 g/dL (ref 30.0–36.0)
MCV: 86.4 fL (ref 78.0–100.0)
Platelets: 247 10*3/uL (ref 150–400)
RBC: 4.62 MIL/uL (ref 3.87–5.11)
RDW: 14.5 % (ref 11.5–15.5)
WBC: 8.4 10*3/uL (ref 4.0–10.5)

## 2015-04-24 LAB — URINE CULTURE

## 2015-04-24 LAB — GLUCOSE, CAPILLARY: GLUCOSE-CAPILLARY: 73 mg/dL (ref 65–99)

## 2015-04-24 NOTE — Progress Notes (Signed)
Patient Demographics  Cheryl Stevens, is a 70 y.o. female, DOB - 21-May-1945, [id number]  Admit date - 04/23/2015   Admitting Physician Cheryl Morton, MD  Outpatient Primary MD for the patient is Cheryl Hoist, MD  LOS - 1   Chief Complaint  Patient presents with  . Emesis        Subjective:   Cheryl Stevens today has, No headache, No chest pain,Denies any vomiting since admission, nausea significantly subsided, as well as abdominal pain significantly improved . Assessment & Plan    Active Problems:   Abdominal pain   Ileitis   AP (abdominal pain)   Nausea with vomiting  Nausea, vomiting and abdominal pain secondary to ileitis - CT abdomen significant for area of ileitis, patient is afebrile, leukocytosis significantly subsided, continue with IV ciprofloxacin and Flagyl, tolerating clear liquid diet, will advance to full liquid diet today, continue with IV fluid, when necessary nausea and pain medication.  Hypertension  - Blood pressure acceptable, continue with amlodipine   Diabetes mellitus - Continue with glipizide   Code Status: Full  Family Communication: None at bedside  Disposition Plan: Home when stable   Procedures  None   Consults   None   Medications  Scheduled Meds: . amLODipine  2.5 mg Oral Daily  . ciprofloxacin  400 mg Intravenous Q12H  . enoxaparin (LOVENOX) injection  40 mg Subcutaneous Q24H  . gabapentin  100 mg Oral q morning - 10a   And  . gabapentin  300 mg Oral QHS  . glipiZIDE  5 mg Oral Q breakfast  . metronidazole  500 mg Intravenous Q8H   Continuous Infusions: . sodium chloride 75 mL/hr at 04/23/15 2132   PRN Meds:.morphine injection, ondansetron **OR** ondansetron (ZOFRAN) IV, senna-docusate  DVT Prophylaxis  Lovenox   Lab Results  Component Value Date   PLT 247 04/24/2015    Antibiotics    Anti-infectives    Start      Dose/Rate Route Frequency Ordered Stop   04/23/15 1200  metroNIDAZOLE (FLAGYL) IVPB 500 mg     500 mg 100 mL/hr over 60 Minutes Intravenous Every 8 hours 04/23/15 0914     04/23/15 1000  ciprofloxacin (CIPRO) IVPB 400 mg     400 mg 200 mL/hr over 60 Minutes Intravenous Every 12 hours 04/23/15 0914     04/23/15 0615  metroNIDAZOLE (FLAGYL) IVPB 500 mg     500 mg 100 mL/hr over 60 Minutes Intravenous  Once 04/23/15 0609 04/23/15 0730          Objective:   Filed Vitals:   04/23/15 0821 04/23/15 1358 04/23/15 2223 04/24/15 0546  BP: 153/53 136/60 151/69 130/63  Pulse: 79 73 71 70  Temp: 98 F (36.7 C) 98.9 F (37.2 C) 98.2 F (36.8 C) 98.3 F (36.8 C)  TempSrc: Oral Oral Oral Oral  Resp:   16 16  Height:      Weight:      SpO2: 95% 93% 94% 94%    Wt Readings from Last 3 Encounters:  04/23/15 86.183 kg (190 lb)  09/09/13 86.183 kg (190 lb)     Intake/Output Summary (Last 24 hours) at 04/24/15 1221 Last data filed at 04/24/15 1025  Gross per 24 hour  Intake  2023.75 ml  Output   1350 ml  Net 673.75 ml     Physical Exam  Awake Alert, Oriented X 3, No new F.N deficits, Normal affect [place].AT,PERRAL Supple Neck,No JVD, No cervical lymphadenopathy appriciated.  Symmetrical Chest wall movement, Good air movement bilaterally, CTAB RRR,No Gallops,Rubs or new Murmurs, No Parasternal Heave +ve B.Sounds, Abd Soft,Mild tenderness in mid abdomen area , No organomegaly appriciated, No rebound - guarding or rigidity. No Cyanosis, Clubbing or edema, No new Rash or bruise   Data Review   Micro Results Recent Results (from the past 240 hour(s))  Culture, Urine     Status: None   Collection Time: 04/23/15 11:05 AM  Result Value Ref Range Status   Specimen Description URINE, CLEAN CATCH  Final   Special Requests NONE  Final   Culture   Final    MULTIPLE SPECIES PRESENT, SUGGEST RECOLLECTION Performed at [facility]    Report Status 04/24/2015 FINAL  Final     Radiology Reports Ct Abdomen Pelvis W Contrast  04/23/2015  CLINICAL DATA:  Acute onset of vomiting and leukocytosis. Lower abdominal pain. Initial encounter. EXAM: CT ABDOMEN AND PELVIS WITH CONTRAST TECHNIQUE: Multidetector CT imaging of the abdomen and pelvis was performed using the standard protocol following bolus administration of intravenous contrast. CONTRAST:  140mL ISOVUE-300 IOPAMIDOL (ISOVUE-300) INJECTION 61% COMPARISON:  CT of the abdomen and pelvis from 09/09/2013 FINDINGS: Minimal bibasilar atelectasis is noted. A 1.5 cm hypodensity is noted within the left hepatic lobe. The liver and spleen are otherwise unremarkable. The gallbladder is within normal limits. The pancreas and adrenal glands are unremarkable. A few tiny bilateral renal cysts are noted. A 5 mm stone is noted at the interpole region of the left kidney. There is no evidence of hydronephrosis. No obstructing ureteral stones are seen. No perinephric stranding is appreciated. There is fecalization of a long segment of mid to distal ileum at the right lower quadrant, with mild wall thickening along the more distal small bowel. This may reflect small bowel dysmotility due to mild infectious or inflammatory ileitis, or possibly an adhesion. Mild associated soft tissue inflammation is seen. There is no evidence of significant small bowel obstruction. The stomach is within normal limits. No acute vascular abnormalities are seen. Scattered calcification is noted along the abdominal aorta and its branches. The appendix is not definitely characterized; there is no evidence of appendicitis. The cecum is noted at the right hemipelvis. Trace fluid adjacent to the cecum is thought to be physiologic in nature. Scattered diverticulosis is noted along the entirety of the colon, without evidence of diverticulitis. The bladder is mildly distended and grossly unremarkable. The patient is status post hysterectomy. No suspicious adnexal masses are seen.  No inguinal lymphadenopathy is seen. No acute osseous abnormalities are identified. Degenerative change is noted at both hip joints, with subcortical cystic change and cortical irregularity. 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 Vacuum phenomenon is noted at L4-L5, with mild underlying facet disease. IMPRESSION: 1. Fecalization of a long segment of mid to distal ileum at the right lower quadrant, with mild wall thickening along the more distal small bowel. This may reflect small bowel dysmotility due to mild infectious or inflammatory ileitis, or possibly secondary to an underlying adhesion. Mild associated soft tissue inflammation. No evidence of significant small bowel obstruction. 2. Scattered diverticulosis along the entirety of the colon, without evidence of diverticulitis. 3. Small amount of free fluid within the pelvis is thought to be  physiologic in nature. 4. Few tiny bilateral renal cysts seen. 5 mm nonobstructing stone at the interpole region of the left kidney. 5. 1.5 cm nonspecific hypodensity within the left hepatic lobe. This has increased mildly in size from 2015, but likely reflects a cyst. 6. Scattered calcification along the abdominal aorta and its branches. 7. Subcortical cystic change and cortical irregularity at both hips. 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 Electronically Signed   By: Garald Balding M.D.   On: 04/23/2015 04:34     CBC  Recent Labs Lab 04/23/15 0215 04/23/15 0940 04/24/15 0434  WBC 14.9* 12.1* 8.4  HGB 16.1* 13.3 13.1  HCT 47.1* 39.4 39.9  PLT 256 261 247  MCV 84.4 83.3 86.4  MCH 28.9 28.1 28.4  MCHC 34.2 33.8 32.8  RDW 14.5 14.0 14.5  LYMPHSABS 2.3  --   --   MONOABS 0.6  --   --   EOSABS 0.1  --   --   BASOSABS 0.1  --   --     Chemistries   Recent Labs Lab 04/23/15 0215 04/23/15 0940 04/24/15 0434  NA 140  --  145  K 4.1  --  3.9  CL 107  --  112*  CO2  20*  --  26  GLUCOSE 203*  --  99  BUN 13  --  9  CREATININE 0.81 0.66 0.60  CALCIUM 10.1  --  8.6*  AST 33  --  24  ALT 24  --  18  ALKPHOS 123  --  84  BILITOT 0.6  --  0.6   ------------------------------------------------------------------------------------------------------------------ estimated creatinine clearance is 74.5 mL/min (by C-G formula based on Cr of 0.6). ------------------------------------------------------------------------------------------------------------------ No results for input(s): HGBA1C in the last 72 hours. ------------------------------------------------------------------------------------------------------------------ No results for input(s): CHOL, HDL, LDLCALC, TRIG, CHOLHDL, LDLDIRECT in the last 72 hours. ------------------------------------------------------------------------------------------------------------------ No results for input(s): TSH, T4TOTAL, T3FREE, THYROIDAB in the last 72 hours.  Invalid input(s): FREET3 ------------------------------------------------------------------------------------------------------------------ No results for input(s): VITAMINB12, FOLATE, FERRITIN, TIBC, IRON, RETICCTPCT in the last 72 hours.  Coagulation profile No results for input(s): INR, PROTIME in the last 168 hours.  No results for input(s): DDIMER in the last 72 hours.  Cardiac Enzymes No results for input(s): CKMB, TROPONINI, MYOGLOBIN in the last 168 hours.  Invalid input(s): CK ------------------------------------------------------------------------------------------------------------------ Invalid input(s): POCBNP     Time Spent in minutes   25 minutes   Deniesha Stenglein M.D on 04/24/2015 at 12:21 PM  Between 7am to 7pm - Pager - [phone]  After 7pm go to [email or web address] - password [facility]  [facility]   Office  [phone]

## 2015-04-25 DIAGNOSIS — E119 Type 2 diabetes mellitus without complications: Secondary | ICD-10-CM

## 2015-04-25 LAB — BASIC METABOLIC PANEL
ANION GAP: 9 (ref 5–15)
BUN: 7 mg/dL (ref 6–20)
CHLORIDE: 107 mmol/L (ref 101–111)
CO2: 26 mmol/L (ref 22–32)
Calcium: 8.7 mg/dL — ABNORMAL LOW (ref 8.9–10.3)
Creatinine, Ser: 0.81 mg/dL (ref 0.44–1.00)
Glucose, Bld: 100 mg/dL — ABNORMAL HIGH (ref 65–99)
POTASSIUM: 3.7 mmol/L (ref 3.5–5.1)
SODIUM: 142 mmol/L (ref 135–145)

## 2015-04-25 LAB — CBC
HCT: 40.6 % (ref 36.0–46.0)
HEMOGLOBIN: 13.7 g/dL (ref 12.0–15.0)
MCH: 28.1 pg (ref 26.0–34.0)
MCHC: 33.7 g/dL (ref 30.0–36.0)
MCV: 83.4 fL (ref 78.0–100.0)
Platelets: 270 10*3/uL (ref 150–400)
RBC: 4.87 MIL/uL (ref 3.87–5.11)
RDW: 14 % (ref 11.5–15.5)
WBC: 9.9 10*3/uL (ref 4.0–10.5)

## 2015-04-25 LAB — GLUCOSE, CAPILLARY: GLUCOSE-CAPILLARY: 87 mg/dL (ref 65–99)

## 2015-04-25 MED ORDER — SACCHAROMYCES BOULARDII 250 MG PO CAPS: 250.0000 mg | ORAL_CAPSULE | Freq: Two times a day (BID) | ORAL | Status: AC

## 2015-04-25 MED ORDER — CIPROFLOXACIN HCL 500 MG PO TABS: 500.0000 mg | ORAL_TABLET | Freq: Two times a day (BID) | ORAL | Status: DC

## 2015-04-25 MED ORDER — METRONIDAZOLE 500 MG PO TABS
500.0000 mg | ORAL_TABLET | Freq: Three times a day (TID) | ORAL | Status: DC
Start: 2015-04-25 — End: 2020-02-18

## 2015-04-25 NOTE — Discharge Instructions (Signed)
Follow with Primary MD Tempie Hoist, MD in 7 days   Get CBC, CMP,  checked  by Primary MD next visit.    Activity: As tolerated with Full fall precautions use walker/cane & assistance as needed   Disposition Home    Diet: soft diet , with feeding assistance and aspiration precautions.  For Heart failure patients - Check your Weight same time everyday, if you gain over 2 pounds, or you develop in leg swelling, experience more shortness of breath or chest pain, call your Primary MD immediately. Follow Cardiac Low Salt Diet and 1.5 lit/day fluid restriction.   On your next visit with your primary care physician please Get Medicines reviewed and adjusted.   Please request your Prim.MD to go over all Hospital Tests and Procedure/Radiological results at the follow up, please get all Hospital records sent to your Prim MD by signing hospital release before you go home.   If you experience worsening of your admission symptoms, develop shortness of breath, life threatening emergency, suicidal or homicidal thoughts you must seek medical attention immediately by calling 911 or calling your MD immediately  if symptoms less severe.  You Must read complete instructions/literature along with all the possible adverse reactions/side effects for all the Medicines you take and that have been prescribed to you. Take any new Medicines after you have completely understood and accpet all the possible adverse reactions/side effects.   Do not drive, operating heavy machinery, perform activities at heights, swimming or participation in water activities or provide baby sitting services if your were admitted for syncope or siezures until you have seen by Primary MD or a Neurologist and advised to do so again.  Do not drive when taking Pain medications.    Do not take more than prescribed Pain, Sleep and Anxiety Medications  Special Instructions: If you have smoked or chewed Tobacco  in the last 2 yrs please  stop smoking, stop any regular Alcohol  and or any Recreational drug use.  Wear Seat belts while driving.   Please note  You were cared for by a hospitalist during your hospital stay. If you have any questions about your discharge medications or the care you received while you were in the hospital after you are discharged, you can call the unit and asked to speak with the hospitalist on call if the hospitalist that took care of you is not available. Once you are discharged, your primary care physician will handle any further medical issues. Please note that NO REFILLS for any discharge medications will be authorized once you are discharged, as it is imperative that you return to your primary care physician (or establish a relationship with a primary care physician if you do not have one) for your aftercare needs so that they can reassess your need for medications and monitor your lab values.

## 2015-04-25 NOTE — Discharge Summary (Signed)
Cheryl Stevens, is a 70 y.o. female  DOB 15-Aug-1945  MRN [id number].  Admission date:  04/23/2015  Admitting Physician  Norval Morton, MD  Discharge Date:  04/25/2015   Primary MD  Tempie Hoist, MD  Recommendations for primary care physician for things to follow:  - CBC, BMP during next visit   Admission Diagnosis  Abdominal pain, unspecified abdominal location [R10.9]   Discharge Diagnosis  Abdominal pain, unspecified abdominal location [R10.9]    Active Problems:   Abdominal pain   Ileitis   AP (abdominal pain)   Nausea with vomiting      Past Medical History  Diagnosis Date  . Hypertension   . High cholesterol     Past Surgical History  Procedure Laterality Date  . Abdominal hysterectomy         History of present illness and  Hospital Course:     Kindly see H&P for history of present illness and admission details, please review complete Labs, Consult reports and Test reports for all details in brief  HPI  from the history and physical done on the day of admission 04/23/2015 Chief Complaint: Abdominal pain, intractable nausea and vomiting of 12-14 hour duration prior to arrival at [facility] overnight on 3-31  HPI: Cheryl Stevens is a 70 y.o. female with a past medical history significant for hypertension, diabetes, dyslipidemia. 2 years ago, patient was evaluated at emergency [facility] for abdominal pain nausea vomiting, essentially same symptoms. At that time she was diagnosed with possibly viral gastroenteritis and was discharged home after evaluation from the emergency department for a few hours.  Patient has a surgical history significant for hysterectomy and appendectomy. Patient states that she has been having abdominal discomfort, cramping squeezing type pain essentially localized to bilateral lower quadrants and suprapubic area. This has been  going on for the last 12-18 hours. Additionally, she had severe intractable nausea and vomiting. She could not keep anything down. Patient denies any history of blood loss in her stools. Denies any history of constipation. Last bowel movement was 3-31. Patient does not have a history of inflammatory bowel disease. Her last colonoscopy was 2 years ago. She has colonoscopies every 5 years because of a noncancerous polyp diagnosed on her last colonoscopy. Her gastroenterologist is at [facility]. Patient denies any fevers or chills. Denies any diarrhea.  She presented herself to [facility]. Abdominal imaging over there revealed a fecalization of a long segment mid to distal ileum at right lower quadrant. Mild wall thickening also noted along distal small bowel. No evidence of small bowel obstruction. Workup at [facility] also positive for leukocytosis of 14.9, hemoglobin 16.1 likely representing contraction/dehydration, elevated lipase also likely reflective for nausea vomiting.  Patient is a very pleasant lady is resting in bed. She received regular breakfast or what was only able to drink ginger ale off of that tray. She still complains of some mild abdominal bilateral lower quadrant cramping going on. Currently, she does not feel  nauseous has not vomited since arrival from [facility].    Hospital Course  Nausea, vomiting and abdominal pain secondary to ileitis - CT abdomen significant for area of  ileitis, patient is afebrile during hospital stay, leukocytosis resolved, treated with IV Cipro and Flagyl during hospital stay, to continue another 4 days on by mouth antibiotics, diet advanced gradually, tolerating soft diet today, will be discharged home, to follow with PCP in one week.   Hypertension  - Blood pressure acceptable, continue with amlodipine   Diabetes mellitus - Continue with glipizide     Discharge Condition:  Stable   Follow UP  Follow-up  Information    Follow up with RAGONESI,PETER, MD. Schedule an appointment as soon as possible for a visit in 1 week.   Specialty:  Internal Medicine   Contact information:   [place] [address] [phone]         Discharge Instructions  and  Discharge Medications     Discharge Instructions    Discharge instructions    Complete by:  As directed   Follow with Primary MD Tempie Hoist, MD in 7 days   Get CBC, CMP,  checked  by Primary MD next visit.    Activity: As tolerated with Full fall precautions use walker/cane & assistance as needed   Disposition Home    Diet: soft diet , with feeding assistance and aspiration precautions.  For Heart failure patients - Check your Weight same time everyday, if you gain over 2 pounds, or you develop in leg swelling, experience more shortness of breath or chest pain, call your Primary MD immediately. Follow Cardiac Low Salt Diet and 1.5 lit/day fluid restriction.   On your next visit with your primary care physician please Get Medicines reviewed and adjusted.   Please request your Prim.MD to go over all Hospital Tests and Procedure/Radiological results at the follow up, please get all Hospital records sent to your Prim MD by signing hospital release before you go home.   If you experience worsening of your admission symptoms, develop shortness of breath, life threatening emergency, suicidal or homicidal thoughts you must seek medical attention immediately by calling 911 or calling your MD immediately  if symptoms less severe.  You Must read complete instructions/literature along with all the possible adverse reactions/side effects for all the Medicines you take and that have been prescribed to you. Take any new Medicines after you have completely understood and accpet all the possible adverse reactions/side effects.   Do not drive, operating heavy machinery, perform activities at heights, swimming or  participation in water activities or provide baby sitting services if your were admitted for syncope or siezures until you have seen by Primary MD or a Neurologist and advised to do so again.  Do not drive when taking Pain medications.    Do not take more than prescribed Pain, Sleep and Anxiety Medications  Special Instructions: If you have smoked or chewed Tobacco  in the last 2 yrs please stop smoking, stop any regular Alcohol  and or any Recreational drug use.  Wear Seat belts while driving.   Please note  You were cared for by a hospitalist during your hospital stay. If you have any questions about your discharge medications or the care you received while you were in the hospital after you are discharged, you can call the unit and asked to speak with the hospitalist on call if the hospitalist that took care of you is  not available. Once you are discharged, your primary care physician will handle any further medical issues. Please note that NO REFILLS for any discharge medications will be authorized once you are discharged, as it is imperative that you return to your primary care physician (or establish a relationship with a primary care physician if you do not have one) for your aftercare needs so that they can reassess your need for medications and monitor your lab values.     Increase activity slowly    Complete by:  As directed             Medication List    STOP taking these medications        cefdinir 300 MG capsule  Commonly known as:  OMNICEF      TAKE these medications        amLODipine 2.5 MG tablet  Commonly known as:  NORVASC  Take 2.5 mg by mouth daily.     B-complex with vitamin C tablet  Take 1 tablet by mouth daily.     Biotin 10 MG Tabs  Take 10 mg by mouth every morning.     ciprofloxacin 500 MG tablet  Commonly known as:  CIPRO  Take 1 tablet (500 mg total) by mouth 2 (two) times daily.     gabapentin 100 MG capsule  Commonly known as:  NEURONTIN    Take 100 mg by mouth every morning.     gabapentin 300 MG capsule  Commonly known as:  NEURONTIN  Take 300 mg by mouth at bedtime.     GLIPIZIDE XL 5 MG 24 hr tablet  Generic drug:  glipiZIDE  Take 5 mg by mouth daily with breakfast.     metroNIDAZOLE 500 MG tablet  Commonly known as:  FLAGYL  Take 1 tablet (500 mg total) by mouth 3 (three) times daily.     multivitamin with minerals Tabs tablet  Take 1 tablet by mouth daily.     omeprazole 20 MG capsule  Commonly known as:  PRILOSEC  Take 20 mg by mouth daily.     rosuvastatin 10 MG tablet  Commonly known as:  CRESTOR  Take 10 mg by mouth every morning.     saccharomyces boulardii 250 MG capsule  Commonly known as:  FLORASTOR  Take 1 capsule (250 mg total) by mouth 2 (two) times daily.          Diet and Activity recommendation: See Discharge Instructions above   Consults obtained -  None   Major procedures and Radiology Reports - PLEASE review detailed and final reports for all details, in brief -      Ct Abdomen Pelvis W Contrast  04/23/2015  CLINICAL DATA:  Acute onset of vomiting and leukocytosis. Lower abdominal pain. Initial encounter. EXAM: CT ABDOMEN AND PELVIS WITH CONTRAST TECHNIQUE: Multidetector CT imaging of the abdomen and pelvis was performed using the standard protocol following bolus administration of intravenous contrast. CONTRAST:  156mL ISOVUE-300 IOPAMIDOL (ISOVUE-300) INJECTION 61% COMPARISON:  CT of the abdomen and pelvis from 09/09/2013 FINDINGS: Minimal bibasilar atelectasis is noted. A 1.5 cm hypodensity is noted within the left hepatic lobe. The liver and spleen are otherwise unremarkable. The gallbladder is within normal limits. The pancreas and adrenal glands are unremarkable. A few tiny bilateral renal cysts are noted. A 5 mm stone is noted at the interpole region of the left kidney. There is no evidence of hydronephrosis. No obstructing ureteral stones are seen. No perinephric stranding  is appreciated.  There is fecalization of a long segment of mid to distal ileum at the right lower quadrant, with mild wall thickening along the more distal small bowel. This may reflect small bowel dysmotility due to mild infectious or inflammatory ileitis, or possibly an adhesion. Mild associated soft tissue inflammation is seen. There is no evidence of significant small bowel obstruction. The stomach is within normal limits. No acute vascular abnormalities are seen. Scattered calcification is noted along the abdominal aorta and its branches. The appendix is not definitely characterized; there is no evidence of appendicitis. The cecum is noted at the right hemipelvis. Trace fluid adjacent to the cecum is thought to be physiologic in nature. Scattered diverticulosis is noted along the entirety of the colon, without evidence of diverticulitis. The bladder is mildly distended and grossly unremarkable. The patient is status post hysterectomy. No suspicious adnexal masses are seen. No inguinal lymphadenopathy is seen. No acute osseous abnormalities are identified. Degenerative change is noted at both hip joints, with subcortical cystic change and cortical irregularity. 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 Vacuum phenomenon is noted at L4-L5, with mild underlying facet disease. IMPRESSION: 1. Fecalization of a long segment of mid to distal ileum at the right lower quadrant, with mild wall thickening along the more distal small bowel. This may reflect small bowel dysmotility due to mild infectious or inflammatory ileitis, or possibly secondary to an underlying adhesion. Mild associated soft tissue inflammation. No evidence of significant small bowel obstruction. 2. Scattered diverticulosis along the entirety of the colon, without evidence of diverticulitis. 3. Small amount of free fluid within the pelvis is thought to be physiologic in nature. 4. Few tiny bilateral renal cysts  seen. 5 mm nonobstructing stone at the interpole region of the left kidney. 5. 1.5 cm nonspecific hypodensity within the left hepatic lobe. This has increased mildly in size from 2015, but likely reflects a cyst. 6. Scattered calcification along the abdominal aorta and its branches. 7. Subcortical cystic change and cortical irregularity at both hips. 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 Electronically Signed   By: Garald Balding M.D.   On: 04/23/2015 04:34    Micro Results     Recent Results (from the past 240 hour(s))  Culture, Urine     Status: None   Collection Time: 04/23/15 11:05 AM  Result Value Ref Range Status   Specimen Description URINE, CLEAN CATCH  Final   Special Requests NONE  Final   Culture   Final    MULTIPLE SPECIES PRESENT, SUGGEST RECOLLECTION Performed at [facility]    Report Status 04/24/2015 FINAL  Final       Today   Subjective:   Cheryl Stevens today has no headache,no chest pain,no new weakness tingling or numbness, feels much better wants to go home today. Reports abdominal pain significantly improved, tolerating by mouth intake with no nausea or vomiting.  Objective:   Blood pressure 139/70, pulse 98, temperature 97.8 F (36.6 C), temperature source Oral, resp. rate 18, height 5\' 6"  (1.676 m), weight 86.183 kg (190 lb), SpO2 97 %.   Intake/Output Summary (Last 24 hours) at 04/25/15 1341 Last data filed at 04/25/15 0600  Gross per 24 hour  Intake   2290 ml  Output   1800 ml  Net    490 ml    Exam Awake Alert, Oriented x 3, No new F.N deficits, Normal affect [place].AT,PERRAL Supple Neck,No JVD, No cervical lymphadenopathy appriciated.  Symmetrical Chest wall movement, Good air movement bilaterally, CTAB RRR,No Gallops,Rubs or new Murmurs, No Parasternal Heave +ve B.Sounds, Abd Soft, Non tender, No organomegaly appriciated, No rebound -guarding or rigidity. No Cyanosis, Clubbing or edema, No  new Rash or bruise  Data Review   CBC w Diff: Lab Results  Component Value Date   WBC 9.9 04/25/2015   HGB 13.7 04/25/2015   HCT 40.6 04/25/2015   PLT 270 04/25/2015   LYMPHOPCT 15 04/23/2015   MONOPCT 4 04/23/2015   EOSPCT 0 04/23/2015   BASOPCT 0 04/23/2015    CMP: Lab Results  Component Value Date   NA 142 04/25/2015   K 3.7 04/25/2015   CL 107 04/25/2015   CO2 26 04/25/2015   BUN 7 04/25/2015   CREATININE 0.81 04/25/2015   PROT 5.9* 04/24/2015   ALBUMIN 3.5 04/24/2015   BILITOT 0.6 04/24/2015   ALKPHOS 84 04/24/2015   AST 24 04/24/2015   ALT 18 04/24/2015  .   Total Time in preparing paper work, data evaluation and todays exam - 35 minutes  ELGERGAWY, DAWOOD M.D on 04/25/2015 at 1:41 PM  [facility]   Office  [phone]

## 2016-05-07 DIAGNOSIS — D123 Benign neoplasm of transverse colon: Secondary | ICD-10-CM | POA: Insufficient documentation

## 2018-10-03 DIAGNOSIS — C50211 Malignant neoplasm of upper-inner quadrant of right female breast: Secondary | ICD-10-CM

## 2019-02-01 ENCOUNTER — Encounter (HOSPITAL_BASED_OUTPATIENT_CLINIC_OR_DEPARTMENT_OTHER): Payer: Self-pay

## 2019-02-01 ENCOUNTER — Other Ambulatory Visit: Payer: Self-pay

## 2019-02-01 ENCOUNTER — Emergency Department (HOSPITAL_BASED_OUTPATIENT_CLINIC_OR_DEPARTMENT_OTHER): Payer: Medicare Other

## 2019-02-01 ENCOUNTER — Emergency Department (HOSPITAL_BASED_OUTPATIENT_CLINIC_OR_DEPARTMENT_OTHER)
Admission: EM | Admit: 2019-02-01 | Discharge: 2019-02-01 | Disposition: A | Discharge status: Home / Self Care | Payer: Medicare Other | Attending: Emergency Medicine | Admitting: Emergency Medicine

## 2019-02-01 DIAGNOSIS — I10 Essential (primary) hypertension: Secondary | ICD-10-CM | POA: Insufficient documentation

## 2019-02-01 DIAGNOSIS — Z79899 Other long term (current) drug therapy: Secondary | ICD-10-CM | POA: Insufficient documentation

## 2019-02-01 DIAGNOSIS — M546 Pain in thoracic spine: Secondary | ICD-10-CM | POA: Diagnosis not present

## 2019-02-01 DIAGNOSIS — M549 Dorsalgia, unspecified: Secondary | ICD-10-CM | POA: Diagnosis present

## 2019-02-01 LAB — CBC WITH DIFFERENTIAL/PLATELET
Abs Immature Granulocytes: 0.03 10*3/uL (ref 0.00–0.07)
Basophils Absolute: 0.1 10*3/uL (ref 0.0–0.1)
Basophils Relative: 1 %
Eosinophils Absolute: 0.2 10*3/uL (ref 0.0–0.5)
Eosinophils Relative: 2 %
HCT: 45.3 % (ref 36.0–46.0)
Hemoglobin: 15.3 g/dL — ABNORMAL HIGH (ref 12.0–15.0)
Immature Granulocytes: 0 %
Lymphocytes Relative: 25 %
Lymphs Abs: 2 10*3/uL (ref 0.7–4.0)
MCH: 29.3 pg (ref 26.0–34.0)
MCHC: 33.8 g/dL (ref 30.0–36.0)
MCV: 86.8 fL (ref 80.0–100.0)
Monocytes Absolute: 0.6 10*3/uL (ref 0.1–1.0)
Monocytes Relative: 8 %
Neutro Abs: 4.9 10*3/uL (ref 1.7–7.7)
Neutrophils Relative %: 64 %
Platelets: 247 10*3/uL (ref 150–400)
RBC: 5.22 MIL/uL — ABNORMAL HIGH (ref 3.87–5.11)
RDW: 13.4 % (ref 11.5–15.5)
WBC: 7.7 10*3/uL (ref 4.0–10.5)
nRBC: 0 % (ref 0.0–0.2)

## 2019-02-01 LAB — BASIC METABOLIC PANEL
Anion gap: 10 (ref 5–15)
BUN: 11 mg/dL (ref 8–23)
CO2: 28 mmol/L (ref 22–32)
Calcium: 10.2 mg/dL (ref 8.9–10.3)
Chloride: 104 mmol/L (ref 98–111)
Creatinine, Ser: 0.81 mg/dL (ref 0.44–1.00)
GFR calc Af Amer: >60 mL/min (ref >60)
GFR calc non Af Amer: >60 mL/min (ref >60)
Glucose, Bld: 139 mg/dL — ABNORMAL HIGH (ref 70–99)
Potassium: 4 mmol/L (ref 3.5–5.1)
Sodium: 142 mmol/L (ref 135–145)

## 2019-02-01 LAB — D-DIMER, QUANTITATIVE (NOT AT ARMC): D-Dimer, Quant: 0.3 ug/mL-FEU (ref 0.00–0.50)

## 2019-02-01 LAB — TROPONIN I (HIGH SENSITIVITY): Troponin I (High Sensitivity): 2 ng/L (ref <18)

## 2019-02-01 MED ORDER — CYCLOBENZAPRINE HCL 5 MG PO TABS: 5.0000 mg | ORAL_TABLET | Freq: Two times a day (BID) | ORAL | 0 refills | Status: DC | PRN

## 2019-02-01 NOTE — ED Notes (Signed)
Pt verbalized understanding of dc instructions.

## 2019-02-01 NOTE — ED Triage Notes (Signed)
Pt arrives POV with c/o left shoulder pain radiating into back and ribs. Reports starting a new medication with concerns of reaction, Denies injury.

## 2019-02-01 NOTE — ED Notes (Signed)
Patient transported to X-ray 

## 2019-02-01 NOTE — Discharge Instructions (Signed)
If you develop worsening, recurrent, or continued back pain, numbness or weakness in the legs, incontinence of your bowels or bladders, numbness of your buttocks, fever, abdominal pain, or any other new/concerning symptoms then return to the ER for evaluation.  

## 2019-02-01 NOTE — ED Provider Notes (Signed)
[facility] EMERGENCY DEPARTMENT Provider Note   CSN: [id number] Arrival date & time: 02/01/19  1047     History Chief Complaint  Patient presents with  . Back Pain    Cheryl Stevens is a 74 y.o. female.  HPI 74 year old female presents with left-sided back pain.  Started 2 days ago and comes and goes.  Is sharp and dull.  Nothing specific makes it come on though often it seems to be there in the afternoon and night.  No exertional component.  No cough, shortness of breath, abdominal pain.  It goes around to her side but not really to anterior chest.  She has tried Tylenol which did help her sleep last night.  She wonders if it could be a side effect of the medication she is on to inhibit estrogen for breast cancer but she has been on this for several months.  No new leg swelling.  No pleuritic component.  No urinary symptoms.   Past Medical History:  Diagnosis Date  . High cholesterol   . Hypertension     Patient Active Problem List   Diagnosis Date Noted  . Abdominal pain 04/23/2015  . Ileitis 04/23/2015  . AP (abdominal pain)   . Nausea with vomiting     Past Surgical History:  Procedure Laterality Date  . ABDOMINAL HYSTERECTOMY       OB History   No obstetric history on file.     No family history on file.  Social History   Tobacco Use  . Smoking status: Never Smoker  . Smokeless tobacco: Never Used  Substance Use Topics  . Alcohol use: Yes    Comment: social  . Drug use: Never    Home Medications Prior to Admission medications   Medication Sig Start Date End Date Taking? Authorizing Provider  anastrozole (ARIMIDEX) 1 MG tablet Take by mouth. 11/20/18 02/18/19 Yes [provider]  amLODipine (NORVASC) 2.5 MG tablet Take 2.5 mg by mouth daily.    [provider]  B Complex-C (B-COMPLEX WITH VITAMIN C) tablet Take 1 tablet by mouth daily.    [provider]  Biotin 10 MG TABS Take 10 mg by mouth every morning.     [provider]  ciprofloxacin (CIPRO) 500 MG tablet Take 1 tablet (500 mg total) by mouth 2 (two) times daily. 04/25/15   Elgergawy, Silver Huguenin, MD  cyclobenzaprine (FLEXERIL) 5 MG tablet Take 1-2 tablets (5-10 mg total) by mouth 2 (two) times daily as needed for muscle spasms. 02/01/19   Sherwood Gambler, MD  gabapentin (NEURONTIN) 100 MG capsule Take 100 mg by mouth every morning.  02/26/15   [provider]  gabapentin (NEURONTIN) 300 MG capsule Take 300 mg by mouth at bedtime.  04/17/15   [provider]  GLIPIZIDE XL 5 MG 24 hr tablet Take 5 mg by mouth daily with breakfast.  04/17/15   [provider]  metroNIDAZOLE (FLAGYL) 500 MG tablet Take 1 tablet (500 mg total) by mouth 3 (three) times daily. 04/25/15   Elgergawy, Silver Huguenin, MD  Multiple Vitamin (MULTIVITAMIN WITH MINERALS) TABS tablet Take 1 tablet by mouth daily.    [provider]  omeprazole (PRILOSEC) 20 MG capsule Take 20 mg by mouth daily.  04/17/15   [provider]  rosuvastatin (CRESTOR) 10 MG tablet Take 10 mg by mouth every morning.     [provider]  saccharomyces boulardii (FLORASTOR) 250 MG capsule Take 1 capsule (250 mg total) by mouth  2 (two) times daily. 04/25/15   Elgergawy, Silver Huguenin, MD    Allergies    Lipitor [atorvastatin]  Review of Systems   Review of Systems  Constitutional: Negative for fever.  Respiratory: Negative for shortness of breath.   Cardiovascular: Negative for chest pain.  Gastrointestinal: Negative for abdominal pain.  Genitourinary: Negative for dysuria and hematuria.  Musculoskeletal: Positive for back pain.  Neurological: Negative for weakness and numbness.  All other systems reviewed and are negative.   Physical Exam Updated Vital Signs BP (!) 187/87 (BP Location: Left Arm)   Pulse 82   Temp 98.5 F (36.9 C) (Oral)   Resp 18   Ht 5\' 6"  (1.676 m)   Wt 94.3 kg   SpO2 98%   BMI 33.57 kg/m   Physical Exam Vitals and nursing  note reviewed.  Constitutional:      General: She is not in acute distress.    Appearance: She is well-developed. She is obese. She is not ill-appearing or diaphoretic.  HENT:     Head: Normocephalic and atraumatic.     Right Ear: External ear normal.     Left Ear: External ear normal.     Nose: Nose normal.  Eyes:     General:        Right eye: No discharge.        Left eye: No discharge.  Cardiovascular:     Rate and Rhythm: Normal rate and regular rhythm.     Heart sounds: Normal heart sounds.  Pulmonary:     Effort: Pulmonary effort is normal.     Breath sounds: Normal breath sounds.  Abdominal:     General: There is no distension.     Palpations: Abdomen is soft.     Tenderness: There is no abdominal tenderness.  Musculoskeletal:     Left shoulder: No tenderness. Normal range of motion.     Cervical back: No tenderness.     Thoracic back: No tenderness.       Back:     Comments: No obvious area of tenderness or swelling.  Just medial to the scapula, she reports that my palpation makes it feel little better but there is no localized area of tenderness.  No rash.  Skin:    General: Skin is warm and dry.  Neurological:     Mental Status: She is alert.  Psychiatric:        Mood and Affect: Mood is not anxious.     ED Results / Procedures / Treatments   Labs (all labs ordered are listed, but only abnormal results are displayed) Labs Reviewed  CBC WITH DIFFERENTIAL/PLATELET - Abnormal; Notable for the following components:      Result Value   RBC 5.22 (*)    Hemoglobin 15.3 (*)    All other components within normal limits  BASIC METABOLIC PANEL - Abnormal; Notable for the following components:   Glucose, Bld 139 (*)    All other components within normal limits  D-DIMER, QUANTITATIVE (NOT AT [facility])  TROPONIN I (HIGH SENSITIVITY)    EKG EKG Interpretation  Date/Time:  Sunday February 01 2019 11:36:51 EST Ventricular Rate:  73 PR Interval:    QRS  Duration: 102 QT Interval:  405 QTC Calculation: 447 R Axis:   35 Text Interpretation: Sinus rhythm no acute ST/T changes No old tracing to compare Confirmed by Sherwood Gambler [phone]) on 02/01/2019 11:50:51 AM   Radiology DG Chest 2 View  Result Date: 02/01/2019 CLINICAL DATA:  Chest pain EXAM: CHEST - 2 VIEW COMPARISON:  None. FINDINGS: Lungs are clear. Heart size and pulmonary vascularity are normal. There is mild epicardial fat prominence along the right heart border. No adenopathy. No bone lesions. No pneumothorax. IMPRESSION: Lungs clear. Cardiac silhouette within normal limits. No adenopathy. Electronically Signed   By: Lowella Grip III M.D.   On: 02/01/2019 11:37    Procedures Procedures (including critical care time)  Medications Ordered in ED Medications - No data to display  ED Course  I have reviewed the triage vital signs and the nursing notes.  Pertinent labs & imaging results that were available during my care of the patient were reviewed by me and considered in my medical decision making (see chart for details).    MDM Rules/Calculators/A&P                      Given atypical nature of her back pain with no clear etiology, ECG, troponin, and D-dimer sent but these are all reassuring.  Other labs negative.  Chest x-ray is clear.  Probably this is muscular and I recommended low-dose NSAIDs, Tylenol, heating pad and will give muscle relaxer, though counseled on potential side effects.  I highly doubt dissection.  We discussed return precautions.  Have her follow-up with PCP. Final Clinical Impression(s) / ED Diagnoses Final diagnoses:  Acute left-sided thoracic back pain    Rx / DC Orders ED Discharge Orders         Ordered    cyclobenzaprine (FLEXERIL) 5 MG tablet  2 times daily PRN     02/01/19 1242           Sherwood Gambler, MD 02/01/19 1246

## 2020-02-16 ENCOUNTER — Emergency Department (HOSPITAL_BASED_OUTPATIENT_CLINIC_OR_DEPARTMENT_OTHER): Payer: Medicare Other

## 2020-02-16 ENCOUNTER — Encounter (HOSPITAL_BASED_OUTPATIENT_CLINIC_OR_DEPARTMENT_OTHER): Payer: Self-pay | Admitting: Emergency Medicine

## 2020-02-16 ENCOUNTER — Inpatient Hospital Stay (HOSPITAL_COMMUNITY): Payer: Medicare Other

## 2020-02-16 ENCOUNTER — Inpatient Hospital Stay (HOSPITAL_BASED_OUTPATIENT_CLINIC_OR_DEPARTMENT_OTHER)
Admission: EM | Admit: 2020-02-16 | Discharge: 2020-02-18 | DRG: 389 | Disposition: A | Discharge status: Home / Self Care | Payer: Medicare Other | Attending: General Surgery | Admitting: General Surgery

## 2020-02-16 ENCOUNTER — Other Ambulatory Visit: Payer: Self-pay

## 2020-02-16 DIAGNOSIS — K56609 Unspecified intestinal obstruction, unspecified as to partial versus complete obstruction: Principal | ICD-10-CM | POA: Diagnosis present

## 2020-02-16 DIAGNOSIS — N202 Calculus of kidney with calculus of ureter: Secondary | ICD-10-CM | POA: Diagnosis present

## 2020-02-16 DIAGNOSIS — Z888 Allergy status to other drugs, medicaments and biological substances status: Secondary | ICD-10-CM | POA: Diagnosis not present

## 2020-02-16 DIAGNOSIS — R1030 Lower abdominal pain, unspecified: Secondary | ICD-10-CM | POA: Diagnosis present

## 2020-02-16 DIAGNOSIS — Z20822 Contact with and (suspected) exposure to covid-19: Secondary | ICD-10-CM | POA: Diagnosis present

## 2020-02-16 DIAGNOSIS — E119 Type 2 diabetes mellitus without complications: Secondary | ICD-10-CM | POA: Diagnosis present

## 2020-02-16 DIAGNOSIS — Z87442 Personal history of urinary calculi: Secondary | ICD-10-CM

## 2020-02-16 DIAGNOSIS — K219 Gastro-esophageal reflux disease without esophagitis: Secondary | ICD-10-CM | POA: Diagnosis present

## 2020-02-16 DIAGNOSIS — N201 Calculus of ureter: Secondary | ICD-10-CM

## 2020-02-16 DIAGNOSIS — Z79899 Other long term (current) drug therapy: Secondary | ICD-10-CM | POA: Diagnosis not present

## 2020-02-16 DIAGNOSIS — E7800 Pure hypercholesterolemia, unspecified: Secondary | ICD-10-CM | POA: Diagnosis present

## 2020-02-16 DIAGNOSIS — I10 Essential (primary) hypertension: Secondary | ICD-10-CM | POA: Diagnosis present

## 2020-02-16 DIAGNOSIS — Z0189 Encounter for other specified special examinations: Secondary | ICD-10-CM

## 2020-02-16 LAB — CBC WITH DIFFERENTIAL/PLATELET
Abs Immature Granulocytes: 0.08 10*3/uL — ABNORMAL HIGH (ref 0.00–0.07)
Basophils Absolute: 0.1 10*3/uL (ref 0.0–0.1)
Basophils Relative: 1 %
Eosinophils Absolute: 0 10*3/uL (ref 0.0–0.5)
Eosinophils Relative: 0 %
HCT: 47.2 % — ABNORMAL HIGH (ref 36.0–46.0)
Hemoglobin: 15.7 g/dL — ABNORMAL HIGH (ref 12.0–15.0)
Immature Granulocytes: 1 %
Lymphocytes Relative: 11 %
Lymphs Abs: 1.8 10*3/uL (ref 0.7–4.0)
MCH: 28.9 pg (ref 26.0–34.0)
MCHC: 33.3 g/dL (ref 30.0–36.0)
MCV: 86.8 fL (ref 80.0–100.0)
Monocytes Absolute: 1 10*3/uL (ref 0.1–1.0)
Monocytes Relative: 6 %
Neutro Abs: 13.2 10*3/uL — ABNORMAL HIGH (ref 1.7–7.7)
Neutrophils Relative %: 81 %
Platelets: 303 10*3/uL (ref 150–400)
RBC: 5.44 MIL/uL — ABNORMAL HIGH (ref 3.87–5.11)
RDW: 14 % (ref 11.5–15.5)
WBC: 16.2 10*3/uL — ABNORMAL HIGH (ref 4.0–10.5)
nRBC: 0 % (ref 0.0–0.2)

## 2020-02-16 LAB — URINALYSIS, ROUTINE W REFLEX MICROSCOPIC
Bilirubin Urine: NEGATIVE
Glucose, UA: NEGATIVE mg/dL
Ketones, ur: NEGATIVE mg/dL
Leukocytes,Ua: NEGATIVE
Nitrite: NEGATIVE
Protein, ur: NEGATIVE mg/dL
Specific Gravity, Urine: 1.005 (ref 1.005–1.030)
pH: 8 (ref 5.0–8.0)

## 2020-02-16 LAB — URINALYSIS, MICROSCOPIC (REFLEX)

## 2020-02-16 LAB — COMPREHENSIVE METABOLIC PANEL
ALT: 21 U/L (ref 0–44)
AST: 31 U/L (ref 15–41)
Albumin: 4.3 g/dL (ref 3.5–5.0)
Alkaline Phosphatase: 94 U/L (ref 38–126)
Anion gap: 12 (ref 5–15)
BUN: 15 mg/dL (ref 8–23)
CO2: 27 mmol/L (ref 22–32)
Calcium: 9.7 mg/dL (ref 8.9–10.3)
Chloride: 104 mmol/L (ref 98–111)
Creatinine, Ser: 0.86 mg/dL (ref 0.44–1.00)
GFR, Estimated: >60 mL/min (ref >60)
Glucose, Bld: 151 mg/dL — ABNORMAL HIGH (ref 70–99)
Potassium: 3.9 mmol/L (ref 3.5–5.1)
Sodium: 143 mmol/L (ref 135–145)
Total Bilirubin: 0.6 mg/dL (ref 0.3–1.2)
Total Protein: 7.7 g/dL (ref 6.5–8.1)

## 2020-02-16 LAB — GLUCOSE, CAPILLARY: Glucose-Capillary: 119 mg/dL — ABNORMAL HIGH (ref 70–99)

## 2020-02-16 LAB — LIPASE, BLOOD: Lipase: 39 U/L (ref 11–51)

## 2020-02-16 LAB — SARS CORONAVIRUS 2 (TAT 6-24 HRS): SARS Coronavirus 2: NEGATIVE

## 2020-02-16 MED ORDER — ONDANSETRON HCL 4 MG/2ML IJ SOLN: 4.0000 mg | Freq: Four times a day (QID) | INTRAMUSCULAR | Status: DC | PRN

## 2020-02-16 MED ORDER — SODIUM CHLORIDE 0.9 % IV BOLUS
1000.0000 mL | Freq: Once | INTRAVENOUS | Status: AC
  Administered 2020-02-16: 1000 mL via INTRAVENOUS

## 2020-02-16 MED ORDER — LACTATED RINGERS IV SOLN: INTRAVENOUS | Status: DC

## 2020-02-16 MED ORDER — MORPHINE SULFATE (PF) 4 MG/ML IV SOLN
4.0000 mg | Freq: Once | INTRAVENOUS | Status: AC
  Administered 2020-02-16: 4 mg via INTRAVENOUS
  Filled 2020-02-16: qty 1

## 2020-02-16 MED ORDER — DEXTROSE-NACL 5-0.9 % IV SOLN: INTRAVENOUS | Status: DC

## 2020-02-16 MED ORDER — ONDANSETRON 4 MG PO TBDP: 4.0000 mg | ORAL_TABLET | Freq: Four times a day (QID) | ORAL | Status: DC | PRN

## 2020-02-16 MED ORDER — ONDANSETRON HCL 4 MG/2ML IJ SOLN
4.0000 mg | Freq: Once | INTRAMUSCULAR | Status: AC
  Administered 2020-02-16: 4 mg via INTRAVENOUS
  Filled 2020-02-16: qty 2

## 2020-02-16 MED ORDER — IOHEXOL 300 MG/ML  SOLN
100.0000 mL | Freq: Once | INTRAMUSCULAR | Status: AC | PRN
  Administered 2020-02-16: 100 mL via INTRAVENOUS

## 2020-02-16 MED ORDER — INSULIN ASPART 100 UNIT/ML ~~LOC~~ SOLN: 0.0000 [IU] | SUBCUTANEOUS | Status: DC

## 2020-02-16 MED ORDER — METOPROLOL TARTRATE 5 MG/5ML IV SOLN
5.0000 mg | Freq: Four times a day (QID) | INTRAVENOUS | Status: DC | PRN
Start: 2020-02-16 — End: 2020-02-18

## 2020-02-16 MED ORDER — ENOXAPARIN SODIUM 40 MG/0.4ML ~~LOC~~ SOLN
40.0000 mg | SUBCUTANEOUS | Status: DC
  Administered 2020-02-16 – 2020-02-17 (×2): 40 mg via SUBCUTANEOUS
  Filled 2020-02-16 (×2): qty 0.4

## 2020-02-16 MED ORDER — HYDROMORPHONE HCL 1 MG/ML IJ SOLN
1.0000 mg | INTRAMUSCULAR | Status: DC | PRN
  Administered 2020-02-17 – 2020-02-18 (×2): 1 mg via INTRAVENOUS
  Filled 2020-02-16 (×2): qty 1

## 2020-02-16 MED ORDER — DIATRIZOATE MEGLUMINE & SODIUM 66-10 % PO SOLN
90.0000 mL | Freq: Once | ORAL | Status: AC
  Administered 2020-02-16: 21:00:00 90 mL via NASOGASTRIC
  Filled 2020-02-16: qty 90

## 2020-02-16 MED ORDER — LACTATED RINGERS IV SOLN: INTRAVENOUS | Status: AC

## 2020-02-16 NOTE — ED Notes (Signed)
Small return noted from NG tube

## 2020-02-16 NOTE — ED Notes (Signed)
14Fr NG tube placed left nares, pt tolerated well. Verified with xray

## 2020-02-16 NOTE — ED Notes (Signed)
ED Provider at bedside. 

## 2020-02-16 NOTE — ED Notes (Signed)
Spoke with Sherri  In lab to update that labs are ordered.

## 2020-02-16 NOTE — ED Notes (Signed)
Pt ambulatory with steady gait, stand by assist to restroom for urine specimen

## 2020-02-16 NOTE — ED Notes (Signed)
Report given to [facility]  

## 2020-02-16 NOTE — ED Notes (Signed)
Pt aware urine specimen ordered. Pt reports inability to provide specimen at this time. Specimen collection device provided to patient. 

## 2020-02-16 NOTE — ED Provider Notes (Signed)
[facility] EMERGENCY DEPARTMENT Provider Note   CSN: [id number] Arrival date & time: 02/16/20  [id number]     History Chief Complaint  Patient presents with  . Vomiting    Sukari Beightol is a 75 y.o. female.  HPI 75 year old female presents with lower abdominal pain and vomiting.  Is having left lower quadrant abdominal pain and has had numerous episodes of vomiting since about 1 PM yesterday.  No fevers or back pain.  No diarrhea or urinary symptoms.  Pain is moderate.  No blood in her emesis.  Feels like when she had ileitis about 5 years ago.  Past Medical History:  Diagnosis Date  . High cholesterol   . Hypertension     Patient Active Problem List   Diagnosis Date Noted  . Small bowel obstruction ([place]) 02/16/2020  . Abdominal pain 04/23/2015  . Ileitis 04/23/2015  . AP (abdominal pain)   . Nausea with vomiting     Past Surgical History:  Procedure Laterality Date  . ABDOMINAL HYSTERECTOMY       OB History   No obstetric history on file.     No family history on file.  Social History   Tobacco Use  . Smoking status: Never Smoker  . Smokeless tobacco: Never Used  Substance Use Topics  . Alcohol use: Yes    Comment: social  . Drug use: Never    Home Medications Prior to Admission medications   Medication Sig Start Date End Date Taking? Authorizing Provider  amLODipine (NORVASC) 2.5 MG tablet Take 2.5 mg by mouth daily.   Yes [provider]  B Complex-C (B-COMPLEX WITH VITAMIN C) tablet Take 1 tablet by mouth daily.   Yes [provider]  Biotin 10 MG TABS Take 10 mg by mouth every morning.   Yes [provider]  gabapentin (NEURONTIN) 100 MG capsule Take 100 mg by mouth every morning.  02/26/15  Yes [provider]  gabapentin (NEURONTIN) 300 MG capsule Take 300 mg by mouth at bedtime.  04/17/15  Yes [provider]  GLIPIZIDE XL 5 MG 24 hr tablet Take 5 mg by mouth daily with breakfast.  04/17/15  Yes  [provider]  Multiple Vitamin (MULTIVITAMIN WITH MINERALS) TABS tablet Take 1 tablet by mouth daily.   Yes [provider]  omeprazole (PRILOSEC) 20 MG capsule Take 20 mg by mouth daily.  04/17/15  Yes [provider]  rosuvastatin (CRESTOR) 10 MG tablet Take 10 mg by mouth every morning.    Yes [provider]  saccharomyces boulardii (FLORASTOR) 250 MG capsule Take 1 capsule (250 mg total) by mouth 2 (two) times daily. 04/25/15  Yes Elgergawy, Silver Huguenin, MD  ciprofloxacin (CIPRO) 500 MG tablet Take 1 tablet (500 mg total) by mouth 2 (two) times daily. 04/25/15   Elgergawy, Silver Huguenin, MD  cyclobenzaprine (FLEXERIL) 5 MG tablet Take 1-2 tablets (5-10 mg total) by mouth 2 (two) times daily as needed for muscle spasms. 02/01/19   [place] Gambler, MD  metroNIDAZOLE (FLAGYL) 500 MG tablet Take 1 tablet (500 mg total) by mouth 3 (three) times daily. 04/25/15   Elgergawy, Silver Huguenin, MD    Allergies    Lipitor [atorvastatin] and Metformin and related  Review of Systems   Review of Systems  Constitutional: Negative for fever.  Gastrointestinal: Positive for abdominal pain and vomiting. Negative for diarrhea.  Genitourinary: Negative for dysuria.  Musculoskeletal: Negative for back pain.  All other systems reviewed and are negative.  Physical Exam Updated Vital Signs BP (!) 157/75 (BP Location: Right Arm)   Pulse 90   Temp 98.1 F (36.7 C) (Oral)   Resp 20   Ht 5\' 6"  (1.676 m)   Wt 93.4 kg   SpO2 95%   BMI 33.25 kg/m   Physical Exam Vitals and nursing note reviewed.  Constitutional:      General: She is not in acute distress.    Appearance: She is well-developed and well-nourished. She is not ill-appearing or diaphoretic.  HENT:     Head: Normocephalic and atraumatic.     Right Ear: External ear normal.     Left Ear: External ear normal.     Nose: Nose normal.  Eyes:     General:        Right eye: No discharge.        Left eye: No discharge.   Cardiovascular:     Rate and Rhythm: Normal rate and regular rhythm.     Heart sounds: Normal heart sounds.  Pulmonary:     Effort: Pulmonary effort is normal.     Breath sounds: Normal breath sounds.  Abdominal:     Palpations: Abdomen is soft.     Tenderness: There is abdominal tenderness in the left upper quadrant and left lower quadrant.  Skin:    General: Skin is warm and dry.  Neurological:     Mental Status: She is alert.  Psychiatric:        Mood and Affect: Mood is not anxious.     ED Results / Procedures / Treatments   Labs (all labs ordered are listed, but only abnormal results are displayed) Labs Reviewed  COMPREHENSIVE METABOLIC PANEL - Abnormal; Notable for the following components:      Result Value   Glucose, Bld 151 (*)    All other components within normal limits  CBC WITH DIFFERENTIAL/PLATELET - Abnormal; Notable for the following components:   WBC 16.2 (*)    RBC 5.44 (*)    Hemoglobin 15.7 (*)    HCT 47.2 (*)    Neutro Abs 13.2 (*)    Abs Immature Granulocytes 0.08 (*)    All other components within normal limits  URINALYSIS, ROUTINE W REFLEX MICROSCOPIC - Abnormal; Notable for the following components:   Hgb urine dipstick SMALL (*)    All other components within normal limits  URINALYSIS, MICROSCOPIC (REFLEX) - Abnormal; Notable for the following components:   Bacteria, UA FEW (*)    All other components within normal limits  SARS CORONAVIRUS 2 (TAT 6-24 HRS)  LIPASE, BLOOD    EKG None  Radiology CT ABDOMEN PELVIS W CONTRAST  Result Date: 02/16/2020 CLINICAL DATA:  Lower abdominal pain with vomiting EXAM: CT ABDOMEN AND PELVIS WITH CONTRAST TECHNIQUE: Multidetector CT imaging of the abdomen and pelvis was performed using the standard protocol following bolus administration of intravenous contrast. CONTRAST:  133mL OMNIPAQUE IOHEXOL 300 MG/ML  SOLN COMPARISON:  April 23, 2015 FINDINGS: Lower chest: There is bibasilar atelectasis. No lung base  edema or consolidation. Hepatobiliary: There is a degree of hepatic steatosis. There is an apparent cyst in the left lobe of the liver measuring 1.7 x 1.5 cm, stable from prior study. No new liver lesions are evident. Gallbladder wall is not appreciably thickened. No biliary duct dilatation. Pancreas: No evident pancreatic mass or inflammatory focus. Spleen: No splenic lesions are evident. Adrenals/Urinary Tract: Adrenals bilaterally appear normal. There is a 6 mm cyst arising from the medial mid right  kidney. No hydronephrosis on either side. There is a calculus in the upper pole of the left kidney measuring 7 x 6 mm. There is a 3 x 2 mm calculus in the proximal left ureter at the L3-4 level without associated hydronephrosis. No other ureteral calculi are evident. Urinary bladder is midline with wall thickness within normal limits. Stomach/Bowel: There are multiple descending colonic and sigmoid diverticula without diverticulitis. There are also multiple ascending colonic diverticula without diverticulitis. No appreciable gastric wall thickening. There are multiple loops of dilated small bowel with a relative transition zone in the mid to distal ileum, felt to be indicative of a degree of small-bowel obstruction. Terminal ileal region appears unremarkable. There is no evident free air or portal venous air. There are loops of mildly dilated small bowel Vascular/Lymphatic: No abdominal aortic aneurysm. There is aortic and common iliac artery atherosclerosis. Major venous structures are patent. There is no evident adenopathy in the abdomen or pelvis. Reproductive: Uterus absent.  No adnexal mass region evident. Other: There is mild ascites in the dependent portion of the pelvis. No ascites elsewhere. No periappendiceal region inflammation. No abscess in the abdomen or pelvis. Musculoskeletal: There is degenerative change in the lumbar spine. No blastic or lytic bone lesions. No intramuscular lesions. IMPRESSION: 1.  There is a calculus in the left ureter measuring 3 x 2 mm at the L3-4 level without appreciable hydronephrosis. There is a nonobstructing 7 x 6 mm calculus in the upper pole left kidney. 2. There is a degree of small bowel obstruction with transition zone in the mid to distal ileum. No free air. 3. Multiple colonic diverticula at multiple sites without diverticulitis. 4. Mild ascites in the dependent portion of the pelvis. 5. No evident abscess. 6.  Aortic Atherosclerosis (ICD10-I70.0). 7. Uterus absent. Electronically Signed   By: Lowella Grip III M.D.   On: 02/16/2020 09:36   DG Chest Portable 1 View  Result Date: 02/16/2020 CLINICAL DATA:  75 year old female NG tube placement. EXAM: PORTABLE CHEST 1 VIEW COMPARISON:  02/01/2019 chest radiographs. FINDINGS: Portable AP upright view at 1056 hours. Enteric tube placed into the stomach, side hole the level of the gastric body. Increased air within the stomach. Mild left lung base atelectasis now. Otherwise stable and negative visible chest. Partially visible small bowel gas in the mid abdomen. External straigh-pin artifact suspected at the left axilla. Right lower chest wall/breast small surgical clips again noted. IMPRESSION: NG tube placed into the stomach, side hole at the gastric body. Electronically Signed   By: Genevie Ann M.D.   On: 02/16/2020 11:17    Procedures Procedures   Medications Ordered in ED Medications  lactated ringers infusion ( Intravenous New Bag/Given 02/16/20 1051)  sodium chloride 0.9 % bolus 1,000 mL (0 mLs Intravenous Stopped 02/16/20 0941)  morphine 4 MG/ML injection 4 mg (4 mg Intravenous Given 02/16/20 0825)  ondansetron (ZOFRAN) injection 4 mg (4 mg Intravenous Given 02/16/20 0824)  iohexol (OMNIPAQUE) 300 MG/ML solution 100 mL (100 mLs Intravenous Contrast Given 02/16/20 0912)  morphine 4 MG/ML injection 4 mg (4 mg Intravenous Given 02/16/20 1046)    ED Course  I have reviewed the triage vital signs and the nursing  notes.  Pertinent labs & imaging results that were available during my care of the patient were reviewed by me and considered in my medical decision making (see chart for details).    MDM Rules/Calculators/A&P  Patient appears to have a small bowel obstruction.  I have personally reviewed the CT images.  Her labs are unremarkable besides leukocytosis which may just be reactive to vomiting throughout the night.  Otherwise, her pain is much better with IV morphine.  There is a ureteral stone but with no hydronephrosis I doubt this is causing such significant vomiting and pain.  I discussed with Dr. Harlow Asa, who has reviewed images and accepts as [facility] under surgery service.  Advises placing NG tube.  Will give fluids as well. Final Clinical Impression(s) / ED Diagnoses Final diagnoses:  Small bowel obstruction ([place])  Left ureteral stone    Rx / DC Orders ED Discharge Orders    None       [place] Gambler, MD 02/16/20 1123

## 2020-02-16 NOTE — ED Triage Notes (Signed)
Pt with lower abd pain and vomiting x 18 hours. Denies diarrhea

## 2020-02-17 ENCOUNTER — Inpatient Hospital Stay (HOSPITAL_COMMUNITY): Payer: Medicare Other

## 2020-02-17 LAB — COMPREHENSIVE METABOLIC PANEL
ALT: 17 U/L (ref 0–44)
AST: 22 U/L (ref 15–41)
Albumin: 3.8 g/dL (ref 3.5–5.0)
Alkaline Phosphatase: 80 U/L (ref 38–126)
Anion gap: 11 (ref 5–15)
BUN: 13 mg/dL (ref 8–23)
CO2: 26 mmol/L (ref 22–32)
Calcium: 8.7 mg/dL — ABNORMAL LOW (ref 8.9–10.3)
Chloride: 109 mmol/L (ref 98–111)
Creatinine, Ser: 0.67 mg/dL (ref 0.44–1.00)
GFR, Estimated: >60 mL/min (ref >60)
Glucose, Bld: 135 mg/dL — ABNORMAL HIGH (ref 70–99)
Potassium: 4 mmol/L (ref 3.5–5.1)
Sodium: 146 mmol/L — ABNORMAL HIGH (ref 135–145)
Total Bilirubin: 0.4 mg/dL (ref 0.3–1.2)
Total Protein: 6.6 g/dL (ref 6.5–8.1)

## 2020-02-17 LAB — GLUCOSE, CAPILLARY
Glucose-Capillary: 114 mg/dL — ABNORMAL HIGH (ref 70–99)
Glucose-Capillary: 123 mg/dL — ABNORMAL HIGH (ref 70–99)
Glucose-Capillary: 127 mg/dL — ABNORMAL HIGH (ref 70–99)
Glucose-Capillary: 128 mg/dL — ABNORMAL HIGH (ref 70–99)
Glucose-Capillary: 142 mg/dL — ABNORMAL HIGH (ref 70–99)
Glucose-Capillary: 142 mg/dL — ABNORMAL HIGH (ref 70–99)

## 2020-02-17 LAB — CBC
HCT: 42.1 % (ref 36.0–46.0)
Hemoglobin: 13.5 g/dL (ref 12.0–15.0)
MCH: 29.2 pg (ref 26.0–34.0)
MCHC: 32.1 g/dL (ref 30.0–36.0)
MCV: 91.1 fL (ref 80.0–100.0)
Platelets: 234 10*3/uL (ref 150–400)
RBC: 4.62 MIL/uL (ref 3.87–5.11)
RDW: 14.3 % (ref 11.5–15.5)
WBC: 10.5 10*3/uL (ref 4.0–10.5)
nRBC: 0 % (ref 0.0–0.2)

## 2020-02-17 MED ORDER — ANASTROZOLE 1 MG PO TABS
1.0000 mg | ORAL_TABLET | Freq: Every day | ORAL | Status: DC
Start: 2020-02-17 — End: 2020-02-18
  Administered 2020-02-17 – 2020-02-18 (×2): 1 mg via ORAL
  Filled 2020-02-17 (×2): qty 1

## 2020-02-17 MED ORDER — GABAPENTIN 300 MG PO CAPS
300.0000 mg | ORAL_CAPSULE | Freq: Every day | ORAL | Status: DC
  Administered 2020-02-17: 300 mg via ORAL
  Filled 2020-02-17: qty 1

## 2020-02-17 MED ORDER — GABAPENTIN 100 MG PO CAPS
100.0000 mg | ORAL_CAPSULE | Freq: Every morning | ORAL | Status: DC
Start: 2020-02-17 — End: 2020-02-18
  Administered 2020-02-17 – 2020-02-18 (×2): 100 mg via ORAL
  Filled 2020-02-17 (×2): qty 1

## 2020-02-17 MED ORDER — AMLODIPINE BESYLATE 5 MG PO TABS
2.5000 mg | ORAL_TABLET | Freq: Every day | ORAL | Status: DC
  Administered 2020-02-17 – 2020-02-18 (×2): 2.5 mg via ORAL
  Filled 2020-02-17 (×2): qty 1

## 2020-02-17 MED ORDER — TAMSULOSIN HCL 0.4 MG PO CAPS
0.4000 mg | ORAL_CAPSULE | Freq: Every day | ORAL | Status: DC
  Administered 2020-02-17 – 2020-02-18 (×2): 0.4 mg via ORAL
  Filled 2020-02-17 (×2): qty 1

## 2020-02-17 NOTE — H&P (Signed)
H&P Physician requesting consult: Armandina Gemma  Chief Complaint: Left ureteral calculus  History of Present Illness: 75 year old female currently admitted with a small bowel obstruction.  When she underwent a CT scan on 02/16/2020, she was noted to have a 2 to 3 mm proximal left ureteral calculus as well as a 6 to 7 mm left renal calculus.  She has been having some intermittent mild left-sided pain.  She is otherwise not having any problems in regards to the ureteral stone.  She has a history of nephrolithiasis.  She passed a stone about 20 years ago.  She has never required intervention.  She denies any current pain.  She currently has an NG tube.  She is passing flatus.  Urinalysis on 02/16/2020 was negative for infection.  Creatinine is 0.67 and white blood cell count 10.5.  Past Medical History:  Diagnosis Date  . High cholesterol   . Hypertension    Past Surgical History:  Procedure Laterality Date  . ABDOMINAL HYSTERECTOMY      Home Medications:  Medications Prior to Admission  Medication Sig Dispense Refill Last Dose  . amLODipine (NORVASC) 2.5 MG tablet Take 2.5 mg by mouth daily.   02/15/2020 at Unknown time  . anastrozole (ARIMIDEX) 1 MG tablet Take 1 mg by mouth daily.   02/15/2020 at Unknown time  . B Complex-C (B-COMPLEX WITH VITAMIN C) tablet Take 1 tablet by mouth daily.   02/15/2020 at Unknown time  . Biotin 10 MG TABS Take 10 mg by mouth every morning.   02/15/2020 at Unknown time  . gabapentin (NEURONTIN) 100 MG capsule Take 100 mg by mouth every morning.    02/15/2020 at Unknown time  . gabapentin (NEURONTIN) 300 MG capsule Take 300 mg by mouth at bedtime.    02/15/2020 at Unknown time  . GLIPIZIDE XL 5 MG 24 hr tablet Take 5 mg by mouth daily with breakfast.    02/15/2020 at Unknown time  . Multiple Vitamin (MULTIVITAMIN WITH MINERALS) TABS tablet Take 1 tablet by mouth daily.   02/15/2020 at Unknown time  . omeprazole (PRILOSEC) 20 MG capsule Take 20 mg by mouth daily.     02/15/2020 at Unknown time  . rosuvastatin (CRESTOR) 10 MG tablet Take 10 mg by mouth every morning.    02/15/2020 at Unknown time  . saccharomyces boulardii (FLORASTOR) 250 MG capsule Take 1 capsule (250 mg total) by mouth 2 (two) times daily. 30 capsule 0 02/15/2020 at Unknown time  . ciprofloxacin (CIPRO) 500 MG tablet Take 1 tablet (500 mg total) by mouth 2 (two) times daily. 8 tablet 0   . cyclobenzaprine (FLEXERIL) 5 MG tablet Take 1-2 tablets (5-10 mg total) by mouth 2 (two) times daily as needed for muscle spasms. 15 tablet 0   . metroNIDAZOLE (FLAGYL) 500 MG tablet Take 1 tablet (500 mg total) by mouth 3 (three) times daily. 12 tablet 0    Allergies:  Allergies  Allergen Reactions  . Lipitor [Atorvastatin] Other (See Comments)    Muscle cramps  . Metformin And Related Other (See Comments)    Leg pain    No family history on file. Social History:  reports that she has never smoked. She has never used smokeless tobacco. She reports current alcohol use. She reports that she does not use drugs.  ROS: A complete review of systems was performed.  All systems are negative except for pertinent findings as noted. ROS   Physical Exam:  Vital signs in last 24 hours: Temp:  [  98.3 F (36.8 C)-98.7 F (37.1 C)] 98.7 F (37.1 C) (01/26 0530) Pulse Rate:  [70-90] 70 (01/26 0530) Resp:  [12-20] 17 (01/26 0530) BP: (121-160)/(59-82) 154/75 (01/26 0530) SpO2:  [93 %-96 %] 93 % (01/26 0530) General:  Alert and oriented, No acute distress HEENT: Normocephalic, atraumatic Neck: No JVD or lymphadenopathy Cardiovascular: Regular rate and rhythm Lungs: Regular rate and effort Abdomen: Soft, nontender, nondistended, no abdominal masses Back: No CVA tenderness Extremities: No edema Neurologic: Grossly intact  Laboratory Data:  Results for orders placed or performed during the hospital encounter of 02/16/20 (from the past 24 hour(s))  SARS CORONAVIRUS 2 (TAT 6-24 HRS) Nasopharyngeal  Nasopharyngeal Swab     Status: None   Collection Time: 02/16/20 10:02 AM   Specimen: Nasopharyngeal Swab  Result Value Ref Range   SARS Coronavirus 2 NEGATIVE NEGATIVE  Glucose, capillary     Status: Abnormal   Collection Time: 02/16/20  7:53 PM  Result Value Ref Range   Glucose-Capillary 119 (H) 70 - 99 mg/dL  Glucose, capillary     Status: Abnormal   Collection Time: 02/17/20 12:11 AM  Result Value Ref Range   Glucose-Capillary 142 (H) 70 - 99 mg/dL  Glucose, capillary     Status: Abnormal   Collection Time: 02/17/20  4:02 AM  Result Value Ref Range   Glucose-Capillary 128 (H) 70 - 99 mg/dL  Comprehensive metabolic panel     Status: Abnormal   Collection Time: 02/17/20  5:05 AM  Result Value Ref Range   Sodium 146 (H) 135 - 145 mmol/L   Potassium 4.0 3.5 - 5.1 mmol/L   Chloride 109 98 - 111 mmol/L   CO2 26 22 - 32 mmol/L   Glucose, Bld 135 (H) 70 - 99 mg/dL   BUN 13 8 - 23 mg/dL   Creatinine, Ser 0.67 0.44 - 1.00 mg/dL   Calcium 8.7 (L) 8.9 - 10.3 mg/dL   Total Protein 6.6 6.5 - 8.1 g/dL   Albumin 3.8 3.5 - 5.0 g/dL   AST 22 15 - 41 U/L   ALT 17 0 - 44 U/L   Alkaline Phosphatase 80 38 - 126 U/L   Total Bilirubin 0.4 0.3 - 1.2 mg/dL   GFR, Estimated >60 >60 mL/min   Anion gap 11 5 - 15  CBC     Status: None   Collection Time: 02/17/20  5:05 AM  Result Value Ref Range   WBC 10.5 4.0 - 10.5 K/uL   RBC 4.62 3.87 - 5.11 MIL/uL   Hemoglobin 13.5 12.0 - 15.0 g/dL   HCT 42.1 36.0 - 46.0 %   MCV 91.1 80.0 - 100.0 fL   MCH 29.2 26.0 - 34.0 pg   MCHC 32.1 30.0 - 36.0 g/dL   RDW 14.3 11.5 - 15.5 %   Platelets 234 150 - 400 K/uL   nRBC 0.0 0.0 - 0.2 %  Glucose, capillary     Status: Abnormal   Collection Time: 02/17/20  7:45 AM  Result Value Ref Range   Glucose-Capillary 114 (H) 70 - 99 mg/dL   Recent Results (from the past 240 hour(s))  SARS CORONAVIRUS 2 (TAT 6-24 HRS) Nasopharyngeal Nasopharyngeal Swab     Status: None   Collection Time: 02/16/20 10:02 AM   Specimen:  Nasopharyngeal Swab  Result Value Ref Range Status   SARS Coronavirus 2 NEGATIVE NEGATIVE Final    Comment: (NOTE) SARS-CoV-2 target nucleic acids are NOT DETECTED.  The SARS-CoV-2 RNA is generally detectable in upper  and lower respiratory specimens during the acute phase of infection. Negative results do not preclude SARS-CoV-2 infection, do not rule out co-infections with other pathogens, and should not be used as the sole basis for treatment or other patient management decisions. Negative results must be combined with clinical observations, patient history, and epidemiological information. The expected result is Negative.  Fact Sheet for Patients: SugarRoll.be  Fact Sheet for Healthcare Providers: https://www.woods-mathews.com/  This test is not yet approved or cleared by the [place] FDA and  has been authorized for detection and/or diagnosis of SARS-CoV-2 by FDA under an Emergency Use Authorization (EUA). This EUA will remain  in effect (meaning this test can be used) for the duration of the COVID-19 declaration under Se ction 564(b)(1) of the Act, 21 U.S.C. section 360bbb-3(b)(1), unless the authorization is terminated or revoked sooner.  Performed at [facility], [place] [address] [place], [place] [address]    Creatinine: Recent Labs    02/16/20 0813 02/17/20 0505  CREATININE 0.86 0.67   CT scan personally reviewed and is detailed in history of present illness  Impression/Assessment:  Left renal and ureteral calculus  Plan:  Start Flomax 0.4 mg daily.  Strain all urine.  We will have her follow-up in 1 to 2 weeks with a renal ultrasound and KUB.  I advised her to let us know if she starts to experience severe pain, or fever.  We will proceed with trial passage.  Marton Redwood, III 02/17/2020, 9:49 AM

## 2020-02-17 NOTE — Consult Note (Signed)
[facility] Consult Note  Cheryl Stevens 1945-11-16  [id number].    Requesting MD: Regenia Skeeter, MD Chief Complaint/Reason for Consult: SBO  HPI:  Cheryl Stevens is a 75 y/o F with a PMH HTN, GERD, high cholesterol, and hysterectomy/appendectomy over 20 years ago who presented to [facility] 02/16/20 with a cc 24 vomiting. Patient reports constant emesis for over 24 hours associated with lower abdominal pain and cessation of flatus. Her last BM was the morning of 1/25 and was small, non-bloody. She reports similar pain about 5 years ago when she was admitted with terminal ileitis. She denies fever, chills, chest pain, SOB, dysuria, melena, or hematochezia. She denies tobacco use or use of blood thinning medications. Denies abd surgery other than combo hysterectomy/appendectomy in the 1980's. Denies history of colon cancer -- states she has had a colonoscopy with benign polyps removed.  ED workup included a CT scan concerning for SBO and CCS was asked to evaluate for hospital admission. CT scan also revealed a left ureteral stone and left kidney stone without hydronephrosis. Patient transferred to [facility] for further management and was started on the SBO protocol.   During my exam the patient states she started having flatus last night, feels less bloated, but has not had a BM yet.  ROS: Review of Systems  All other systems reviewed and are negative.   No family history on file.  Past Medical History:  Diagnosis Date  . High cholesterol   . Hypertension     Past Surgical History:  Procedure Laterality Date  . ABDOMINAL HYSTERECTOMY      Social History:  reports that she has never smoked. She has never used smokeless tobacco. She reports current alcohol use. She reports that she does not use drugs.  Allergies:  Allergies  Allergen Reactions  . Lipitor [Atorvastatin] Other (See Comments)    Muscle cramps  . Metformin And Related Other (See Comments)     Leg pain    Medications Prior to Admission  Medication Sig Dispense Refill  . amLODipine (NORVASC) 2.5 MG tablet Take 2.5 mg by mouth daily.    Marland Kitchen anastrozole (ARIMIDEX) 1 MG tablet Take 1 mg by mouth daily.    . B Complex-C (B-COMPLEX WITH VITAMIN C) tablet Take 1 tablet by mouth daily.    . Biotin 10 MG TABS Take 10 mg by mouth every morning.    . gabapentin (NEURONTIN) 100 MG capsule Take 100 mg by mouth every morning.     . gabapentin (NEURONTIN) 300 MG capsule Take 300 mg by mouth at bedtime.     Marland Kitchen GLIPIZIDE XL 5 MG 24 hr tablet Take 5 mg by mouth daily with breakfast.     . Multiple Vitamin (MULTIVITAMIN WITH MINERALS) TABS tablet Take 1 tablet by mouth daily.    Marland Kitchen omeprazole (PRILOSEC) 20 MG capsule Take 20 mg by mouth daily.     . rosuvastatin (CRESTOR) 10 MG tablet Take 10 mg by mouth every morning.     . saccharomyces boulardii (FLORASTOR) 250 MG capsule Take 1 capsule (250 mg total) by mouth 2 (two) times daily. 30 capsule 0  . ciprofloxacin (CIPRO) 500 MG tablet Take 1 tablet (500 mg total) by mouth 2 (two) times daily. 8 tablet 0  . cyclobenzaprine (FLEXERIL) 5 MG tablet Take 1-2 tablets (5-10 mg total) by mouth 2 (two) times daily as needed for muscle spasms. 15 tablet 0  . metroNIDAZOLE (FLAGYL) 500 MG tablet Take 1 tablet (500  mg total) by mouth 3 (three) times daily. 12 tablet 0    Blood pressure (!) 154/75, pulse 70, temperature 98.7 F (37.1 C), temperature source Oral, resp. rate 17, height 5\' 6"  (1.676 m), weight 93.4 kg, SpO2 93 %. Physical Exam: Constitutional: NAD; conversant; no deformities Eyes: Moist conjunctiva; no lid lag; anicteric; PERRL Neck: Trachea midline; no thyromegaly Lungs: Normal respiratory effort; no tactile fremitus CV: RRR; no palpable thrills; no pitting edema GI: Abd soft, obese, mild distention, +BS in all 4 quadrants,  no palpable hepatosplenomegaly, no hernias. NG tube in place with < 300 cc bilious drainage in cannister.  MSK: Normal  gait; no clubbing/cyanosis Psychiatric: Appropriate affect; alert and oriented x3 Lymphatic: No palpable cervical or axillary lymphadenopathy  Results for orders placed or performed during the hospital encounter of 02/16/20 (from the past 48 hour(s))  Comprehensive metabolic panel     Status: Abnormal   Collection Time: 02/16/20  8:13 AM  Result Value Ref Range   Sodium 143 135 - 145 mmol/L   Potassium 3.9 3.5 - 5.1 mmol/L   Chloride 104 98 - 111 mmol/L   CO2 27 22 - 32 mmol/L   Glucose, Bld 151 (H) 70 - 99 mg/dL    Comment: Glucose reference range applies only to samples taken after fasting for at least 8 hours.   BUN 15 8 - 23 mg/dL   Creatinine, Ser 9.56 0.44 - 1.00 mg/dL   Calcium 9.7 8.9 - 38.7 mg/dL   Total Protein 7.7 6.5 - 8.1 g/dL   Albumin 4.3 3.5 - 5.0 g/dL   AST 31 15 - 41 U/L   ALT 21 0 - 44 U/L   Alkaline Phosphatase 94 38 - 126 U/L   Total Bilirubin 0.6 0.3 - 1.2 mg/dL   GFR, Estimated >56 >43 mL/min    Comment: (NOTE) Calculated using the CKD-EPI Creatinine Equation (2021)    Anion gap 12 5 - 15    Comment: Performed at [facility], [address] [place], [place] [address]  Lipase, blood     Status: None   Collection Time: 02/16/20  8:13 AM  Result Value Ref Range   Lipase 39 11 - 51 U/L    Comment: Performed at [facility], [address] [place], [place] [address]  CBC with Differential     Status: Abnormal   Collection Time: 02/16/20  8:13 AM  Result Value Ref Range   WBC 16.2 (H) 4.0 - 10.5 K/uL   RBC 5.44 (H) 3.87 - 5.11 MIL/uL   Hemoglobin 15.7 (H) 12.0 - 15.0 g/dL   HCT 60.6 (H) 30.1 - 60.1 %   MCV 86.8 80.0 - 100.0 fL   MCH 28.9 26.0 - 34.0 pg   MCHC 33.3 30.0 - 36.0 g/dL   RDW 09.3 23.5 - 57.3 %   Platelets 303 150 - 400 K/uL   nRBC 0.0 0.0 - 0.2 %   Neutrophils Relative % 81 %   Neutro Abs 13.2 (H) 1.7 - 7.7 K/uL   Lymphocytes Relative 11 %   Lymphs Abs 1.8 0.7 - 4.0 K/uL   Monocytes Relative 6 %   Monocytes  Absolute 1.0 0.1 - 1.0 K/uL   Eosinophils Relative 0 %   Eosinophils Absolute 0.0 0.0 - 0.5 K/uL   Basophils Relative 1 %   Basophils Absolute 0.1 0.0 - 0.1 K/uL   Immature Granulocytes 1 %   Abs Immature Granulocytes 0.08 (H) 0.00 - 0.07  K/uL    Comment: Performed at [facility], [address] [place], [place] [address]  Urinalysis, Routine w reflex microscopic     Status: Abnormal   Collection Time: 02/16/20  8:13 AM  Result Value Ref Range   Color, Urine YELLOW YELLOW   APPearance CLEAR CLEAR   Specific Gravity, Urine 1.005 1.005 - 1.030   pH 8.0 5.0 - 8.0   Glucose, UA NEGATIVE NEGATIVE mg/dL   Hgb urine dipstick SMALL (A) NEGATIVE   Bilirubin Urine NEGATIVE NEGATIVE   Ketones, ur NEGATIVE NEGATIVE mg/dL   Protein, ur NEGATIVE NEGATIVE mg/dL   Nitrite NEGATIVE NEGATIVE   Leukocytes,Ua NEGATIVE NEGATIVE    Comment: Performed at [facility], [address] [place], [place] [address]  Urinalysis, Microscopic (reflex)     Status: Abnormal   Collection Time: 02/16/20  8:13 AM  Result Value Ref Range   RBC / HPF 0-5 0 - 5 RBC/hpf   WBC, UA 0-5 0 - 5 WBC/hpf   Bacteria, UA FEW (A) NONE SEEN   Squamous Epithelial / LPF 0-5 0 - 5    Comment: Performed at [facility], [address] [place], [place] [address]  SARS CORONAVIRUS 2 (TAT 6-24 HRS) Nasopharyngeal Nasopharyngeal Swab     Status: None   Collection Time: 02/16/20 10:02 AM   Specimen: Nasopharyngeal Swab  Result Value Ref Range   SARS Coronavirus 2 NEGATIVE NEGATIVE    Comment: (NOTE) SARS-CoV-2 target nucleic acids are NOT DETECTED.  The SARS-CoV-2 RNA is generally detectable in upper and lower respiratory specimens during the acute phase of infection. Negative results do not preclude SARS-CoV-2 infection, do not rule out co-infections with other pathogens, and should not be used as the sole basis for treatment or other patient management decisions. Negative results must be  combined with clinical observations, patient history, and epidemiological information. The expected result is Negative.  Fact Sheet for Patients: SugarRoll.be  Fact Sheet for Healthcare Providers: https://www.woods-mathews.com/  This test is not yet approved or cleared by the [place] FDA and  has been authorized for detection and/or diagnosis of SARS-CoV-2 by FDA under an Emergency Use Authorization (EUA). This EUA will remain  in effect (meaning this test can be used) for the duration of the COVID-19 declaration under Se ction 564(b)(1) of the Act, 21 U.S.C. section 360bbb-3(b)(1), unless the authorization is terminated or revoked sooner.  Performed at [facility], [place] [address] [place], [place] [address]   Glucose, capillary     Status: Abnormal   Collection Time: 02/16/20  7:53 PM  Result Value Ref Range   Glucose-Capillary 119 (H) 70 - 99 mg/dL    Comment: Glucose reference range applies only to samples taken after fasting for at least 8 hours.  Glucose, capillary     Status: Abnormal   Collection Time: 02/17/20 12:11 AM  Result Value Ref Range   Glucose-Capillary 142 (H) 70 - 99 mg/dL    Comment: Glucose reference range applies only to samples taken after fasting for at least 8 hours.  Glucose, capillary     Status: Abnormal   Collection Time: 02/17/20  4:02 AM  Result Value Ref Range   Glucose-Capillary 128 (H) 70 - 99 mg/dL    Comment: Glucose reference range applies only to samples taken after fasting for at least 8 hours.  Comprehensive metabolic panel     Status: Abnormal   Collection Time: 02/17/20  5:05 AM  Result Value Ref Range   Sodium 146 (H) 135 - 145 mmol/L   Potassium 4.0 3.5 - 5.1 mmol/L   Chloride 109 98 - 111 mmol/L   CO2 26 22 - 32 mmol/L   Glucose, Bld 135 (H) 70 - 99 mg/dL    Comment: Glucose reference range applies only to samples taken after fasting for at least 8 hours.   BUN 13 8 - 23  mg/dL   Creatinine, Ser 0.67 0.44 - 1.00 mg/dL   Calcium 8.7 (L) 8.9 - 10.3 mg/dL   Total Protein 6.6 6.5 - 8.1 g/dL   Albumin 3.8 3.5 - 5.0 g/dL   AST 22 15 - 41 U/L   ALT 17 0 - 44 U/L   Alkaline Phosphatase 80 38 - 126 U/L   Total Bilirubin 0.4 0.3 - 1.2 mg/dL   GFR, Estimated >60 >60 mL/min    Comment: (NOTE) Calculated using the CKD-EPI Creatinine Equation (2021)    Anion gap 11 5 - 15    Comment: Performed at [facility], [place] [address], [place], [place] [address]  CBC     Status: None   Collection Time: 02/17/20  5:05 AM  Result Value Ref Range   WBC 10.5 4.0 - 10.5 K/uL   RBC 4.62 3.87 - 5.11 MIL/uL   Hemoglobin 13.5 12.0 - 15.0 g/dL   HCT 42.1 36.0 - 46.0 %   MCV 91.1 80.0 - 100.0 fL   MCH 29.2 26.0 - 34.0 pg   MCHC 32.1 30.0 - 36.0 g/dL   RDW 14.3 11.5 - 15.5 %   Platelets 234 150 - 400 K/uL   nRBC 0.0 0.0 - 0.2 %    Comment: Performed at [facility], [place] [address] [place], [place] [address]  Glucose, capillary     Status: Abnormal   Collection Time: 02/17/20  7:45 AM  Result Value Ref Range   Glucose-Capillary 114 (H) 70 - 99 mg/dL    Comment: Glucose reference range applies only to samples taken after fasting for at least 8 hours.   CT ABDOMEN PELVIS W CONTRAST  Result Date: 02/16/2020 CLINICAL DATA:  Lower abdominal pain with vomiting EXAM: CT ABDOMEN AND PELVIS WITH CONTRAST TECHNIQUE: Multidetector CT imaging of the abdomen and pelvis was performed using the standard protocol following bolus administration of intravenous contrast. CONTRAST:  190mL OMNIPAQUE IOHEXOL 300 MG/ML  SOLN COMPARISON:  April 23, 2015 FINDINGS: Lower chest: There is bibasilar atelectasis. No lung base edema or consolidation. Hepatobiliary: There is a degree of hepatic steatosis. There is an apparent cyst in the left lobe of the liver measuring 1.7 x 1.5 cm, stable from prior study. No new liver lesions are evident. Gallbladder wall is not appreciably  thickened. No biliary duct dilatation. Pancreas: No evident pancreatic mass or inflammatory focus. Spleen: No splenic lesions are evident. Adrenals/Urinary Tract: Adrenals bilaterally appear normal. There is a 6 mm cyst arising from the medial mid right kidney. No hydronephrosis on either side. There is a calculus in the upper pole of the left kidney measuring 7 x 6 mm. There is a 3 x 2 mm calculus in the proximal left ureter at the L3-4 level without associated hydronephrosis. No other ureteral calculi are evident. Urinary bladder is midline with wall thickness within normal limits. Stomach/Bowel: There are multiple descending colonic and sigmoid diverticula without diverticulitis. There are also multiple ascending colonic diverticula without diverticulitis. No appreciable gastric wall thickening. There are multiple loops of dilated small  bowel with a relative transition zone in the mid to distal ileum, felt to be indicative of a degree of small-bowel obstruction. Terminal ileal region appears unremarkable. There is no evident free air or portal venous air. There are loops of mildly dilated small bowel Vascular/Lymphatic: No abdominal aortic aneurysm. There is aortic and common iliac artery atherosclerosis. Major venous structures are patent. There is no evident adenopathy in the abdomen or pelvis. Reproductive: Uterus absent.  No adnexal mass region evident. Other: There is mild ascites in the dependent portion of the pelvis. No ascites elsewhere. No periappendiceal region inflammation. No abscess in the abdomen or pelvis. Musculoskeletal: There is degenerative change in the lumbar spine. No blastic or lytic bone lesions. No intramuscular lesions. IMPRESSION: 1. There is a calculus in the left ureter measuring 3 x 2 mm at the L3-4 level without appreciable hydronephrosis. There is a nonobstructing 7 x 6 mm calculus in the upper pole left kidney. 2. There is a degree of small bowel obstruction with transition zone  in the mid to distal ileum. No free air. 3. Multiple colonic diverticula at multiple sites without diverticulitis. 4. Mild ascites in the dependent portion of the pelvis. 5. No evident abscess. 6.  Aortic Atherosclerosis (ICD10-I70.0). 7. Uterus absent. Electronically Signed   By: Lowella Grip III M.D.   On: 02/16/2020 09:36   DG Chest Portable 1 View  Result Date: 02/16/2020 CLINICAL DATA:  75 year old female NG tube placement. EXAM: PORTABLE CHEST 1 VIEW COMPARISON:  02/01/2019 chest radiographs. FINDINGS: Portable AP upright view at 1056 hours. Enteric tube placed into the stomach, side hole the level of the gastric body. Increased air within the stomach. Mild left lung base atelectasis now. Otherwise stable and negative visible chest. Partially visible small bowel gas in the mid abdomen. External straigh-pin artifact suspected at the left axilla. Right lower chest wall/breast small surgical clips again noted. IMPRESSION: NG tube placed into the stomach, side hole at the gastric body. Electronically Signed   By: Genevie Ann M.D.   On: 02/16/2020 11:17   DG Abd Portable 1V-Small Bowel Obstruction Protocol-initial, 8 hr delay  Result Date: 02/17/2020 CLINICAL DATA:  History of abdominal pain and vomiting. 8 hours post contrast administration. EXAM: PORTABLE ABDOMEN - 1 VIEW COMPARISON:  CT 02/16/2020 FINDINGS: NG tube noted coiled in the stomach. Persistent mild small-bowel distention. Oral contrast noted throughout the colon. Colonic diverticulosis noted. No free air. Left renal and left ureteral stone best identified by prior CT. Lumbar scoliosis concave left. Degenerative change lumbar spine and both hips. IMPRESSION: 1. NG tube noted coiled in the stomach. Persistent mild small-bowel distention. Oral contrast noted throughout the colon. Colonic diverticulosis noted. No free air. 2.  Left renal and left ureteral stone best identified by prior CT. Electronically Signed   By: Marcello Moores  Register   On:  02/17/2020 05:53    Assessment/Plan HTN- re-order home norvasc GERD - home meds Diabetes mellitus - on glipizide at home, hold for now. SSI  SBO - afebrile, VSS - SBO protocol >> contrast in the colon and having flatus  - NG with minimal output, clamp NG tube and start sips of clears - clinically and radiographically bowel obstruction is resolving.   Left ureteral stone - non-obstructing, based on history has not passed this stone yet. May need urology office follow up for this. Will call urologist on call to discuss.  FEN: IVF, sips/chips; NG clamped ID: none VTE: SCDs, lovenox Foley: none  Dispo: med-surg, urology consult,  NG clamp trial  EDD: 24 hours   Jill Alexanders, [facility] Please see Amion for pager number during day hours 7:00am-4:30pm 02/17/2020, 9:10 AM

## 2020-02-18 LAB — GLUCOSE, CAPILLARY
Glucose-Capillary: 104 mg/dL — ABNORMAL HIGH (ref 70–99)
Glucose-Capillary: 117 mg/dL — ABNORMAL HIGH (ref 70–99)
Glucose-Capillary: 143 mg/dL — ABNORMAL HIGH (ref 70–99)

## 2020-02-18 MED ORDER — TAMSULOSIN HCL 0.4 MG PO CAPS: 0.4000 mg | ORAL_CAPSULE | Freq: Every day | ORAL | 0 refills | Status: AC

## 2020-02-18 NOTE — Progress Notes (Signed)
Assessment unchanged. Pt verbalized understanding of dc instructions through teach back. Understands meds and follow up care. Discharged via wc to front entrance accompanied by NT.

## 2020-02-18 NOTE — Plan of Care (Signed)

## 2020-03-10 NOTE — Discharge Summary (Addendum)
[facility] Discharge Summary   Patient ID: Cheryl Stevens MRN: [id number] DOB/AGE: 22-Feb-1945 75 y.o.  Admit date: 02/16/2020 Discharge date: 02/18/2020   Discharge Diagnosis Patient Active Problem List   Diagnosis Date Noted  . Small bowel obstruction ([place]) 02/16/2020  . SBO (small bowel obstruction) ([place]) 02/16/2020  . Abdominal pain 04/23/2015  . Ileitis 04/23/2015  . AP (abdominal pain)   . Nausea with vomiting     Consultants Urology - Dr. Gloriann Loan    Procedures None   Hospital Course:  Cheryl Stevens is a 75 y/o F with a PMH HTN, GERD, high cholesterol, and hysterectomy/appendectomy over 20 years ago who presented to [facility] 02/16/20 with a cc 24 vomiting. Patient reports constant emesis for over 24 hours associated with lower abdominal pain and cessation of flatus. Her last BM was the morning of 1/25 and was small, non-bloody. She reports similar pain about 5 years ago when she was admitted with terminal ileitis. She denies fever, chills, chest pain, SOB, dysuria, melena, or hematochezia. She denies tobacco use or use of blood thinning medications. Denies abd surgery other than combo hysterectomy/appendectomy in the 1980's. Denies history of colon cancer -- states she has had a colonoscopy with benign polyps removed.  ED workup included a CT scan concerning for SBO and CCS was asked to evaluate for hospital admission. CT scan also revealed a left ureteral stone and left kidney stone without hydronephrosis. Patient transferred to [facility] for further management and was started on the SBO protocol. Contrast progressed to the colon and the patients bowel function returned. Diet was advanced as tolerated. Urology was consulted for ureteral stone and started daily flomax and scheduled outpatient follow up. On 02/18/20 the patients vitals were stable, pain improved, tolerating PO, having bowel function, and felt stable for discharge. She will follow up  as below and knows to call with questions or concerns.  Physical Exam: General:  Alert, NAD, pleasant, comfortable Abd:  Soft, NT/ND, no HSM  Allergies as of 02/18/2020      Reactions   Lipitor [atorvastatin] Other (See Comments)   Muscle cramps   Metformin And Related Other (See Comments)   Leg pain      Medication List    STOP taking these medications   ciprofloxacin 500 MG tablet Commonly known as: Cipro   cyclobenzaprine 5 MG tablet Commonly known as: FLEXERIL   metroNIDAZOLE 500 MG tablet Commonly known as: FLAGYL     TAKE these medications   amLODipine 2.5 MG tablet Commonly known as: NORVASC Take 2.5 mg by mouth daily.   anastrozole 1 MG tablet Commonly known as: ARIMIDEX Take 1 mg by mouth daily.   B-complex with vitamin C tablet Take 1 tablet by mouth daily.   Biotin 10 MG Tabs Take 10 mg by mouth every morning.   gabapentin 100 MG capsule Commonly known as: NEURONTIN Take 100 mg by mouth every morning.   gabapentin 300 MG capsule Commonly known as: NEURONTIN Take 300 mg by mouth at bedtime.   glipiZIDE XL 5 MG 24 hr tablet Generic drug: glipiZIDE Take 5 mg by mouth daily with breakfast.   multivitamin with minerals Tabs tablet Take 1 tablet by mouth daily.   omeprazole 20 MG capsule Commonly known as: PRILOSEC Take 20 mg by mouth daily.   rosuvastatin 10 MG tablet Commonly known as: CRESTOR Take 10 mg by mouth every morning.   saccharomyces boulardii 250 MG capsule Commonly known as: Florastor Take 1  capsule (250 mg total) by mouth 2 (two) times daily.   tamsulosin 0.4 MG Caps capsule Commonly known as: FLOMAX Take 1 capsule (0.4 mg total) by mouth daily for 20 days.         Follow-up [facility] Follow up.   Specialty: General Surgery Why: call as needed if you have questions/concerns about a bowel obstruction. Contact information: [address] [place] [phone]       Lucas Mallow, MD Follow up.   Specialty: Urology Why: follow up in 1-2 weeks regarding kidney stone -- call to confirm appointment date and time.  Contact information: [place] [id number] [phone]               Signed: Obie Dredge, [facility] 03/10/2020, 2:23 PM

## 2020-04-29 ENCOUNTER — Other Ambulatory Visit: Payer: Self-pay

## 2020-04-29 ENCOUNTER — Emergency Department (HOSPITAL_BASED_OUTPATIENT_CLINIC_OR_DEPARTMENT_OTHER): Payer: Medicare Other

## 2020-04-29 ENCOUNTER — Inpatient Hospital Stay (HOSPITAL_BASED_OUTPATIENT_CLINIC_OR_DEPARTMENT_OTHER)
Admission: EM | Admit: 2020-04-29 | Discharge: 2020-05-01 | DRG: 390 | Disposition: A | Discharge status: Home / Self Care | Payer: Medicare Other | Attending: Student | Admitting: Student

## 2020-04-29 ENCOUNTER — Encounter (HOSPITAL_BASED_OUTPATIENT_CLINIC_OR_DEPARTMENT_OTHER): Payer: Self-pay | Admitting: *Deleted

## 2020-04-29 DIAGNOSIS — C50211 Malignant neoplasm of upper-inner quadrant of right female breast: Secondary | ICD-10-CM

## 2020-04-29 DIAGNOSIS — E669 Obesity, unspecified: Secondary | ICD-10-CM | POA: Diagnosis present

## 2020-04-29 DIAGNOSIS — E7800 Pure hypercholesterolemia, unspecified: Secondary | ICD-10-CM | POA: Diagnosis present

## 2020-04-29 DIAGNOSIS — Z7984 Long term (current) use of oral hypoglycemic drugs: Secondary | ICD-10-CM

## 2020-04-29 DIAGNOSIS — E860 Dehydration: Secondary | ICD-10-CM | POA: Diagnosis present

## 2020-04-29 DIAGNOSIS — K219 Gastro-esophageal reflux disease without esophagitis: Secondary | ICD-10-CM | POA: Diagnosis present

## 2020-04-29 DIAGNOSIS — Z888 Allergy status to other drugs, medicaments and biological substances status: Secondary | ICD-10-CM | POA: Diagnosis not present

## 2020-04-29 DIAGNOSIS — Z20822 Contact with and (suspected) exposure to covid-19: Secondary | ICD-10-CM | POA: Diagnosis present

## 2020-04-29 DIAGNOSIS — E1142 Type 2 diabetes mellitus with diabetic polyneuropathy: Secondary | ICD-10-CM | POA: Diagnosis present

## 2020-04-29 DIAGNOSIS — E782 Mixed hyperlipidemia: Secondary | ICD-10-CM | POA: Diagnosis present

## 2020-04-29 DIAGNOSIS — Z87442 Personal history of urinary calculi: Secondary | ICD-10-CM

## 2020-04-29 DIAGNOSIS — Z853 Personal history of malignant neoplasm of breast: Secondary | ICD-10-CM | POA: Diagnosis not present

## 2020-04-29 DIAGNOSIS — D72825 Bandemia: Secondary | ICD-10-CM | POA: Diagnosis present

## 2020-04-29 DIAGNOSIS — E6609 Other obesity due to excess calories: Secondary | ICD-10-CM | POA: Diagnosis not present

## 2020-04-29 DIAGNOSIS — K56609 Unspecified intestinal obstruction, unspecified as to partial versus complete obstruction: Secondary | ICD-10-CM

## 2020-04-29 DIAGNOSIS — K5651 Intestinal adhesions [bands], with partial obstruction: Secondary | ICD-10-CM | POA: Diagnosis present

## 2020-04-29 DIAGNOSIS — Z6833 Body mass index (BMI) 33.0-33.9, adult: Secondary | ICD-10-CM | POA: Diagnosis not present

## 2020-04-29 DIAGNOSIS — E1169 Type 2 diabetes mellitus with other specified complication: Secondary | ICD-10-CM | POA: Diagnosis present

## 2020-04-29 DIAGNOSIS — I10 Essential (primary) hypertension: Secondary | ICD-10-CM

## 2020-04-29 DIAGNOSIS — Z90710 Acquired absence of both cervix and uterus: Secondary | ICD-10-CM | POA: Diagnosis not present

## 2020-04-29 DIAGNOSIS — Z170 Estrogen receptor positive status [ER+]: Secondary | ICD-10-CM

## 2020-04-29 DIAGNOSIS — Z4659 Encounter for fitting and adjustment of other gastrointestinal appliance and device: Secondary | ICD-10-CM

## 2020-04-29 HISTORY — DX: Essential (primary) hypertension: I10

## 2020-04-29 LAB — COMPREHENSIVE METABOLIC PANEL
ALT: 26 U/L (ref 0–44)
AST: 35 U/L (ref 15–41)
Albumin: 4.4 g/dL (ref 3.5–5.0)
Alkaline Phosphatase: 95 U/L (ref 38–126)
Anion gap: 13 (ref 5–15)
BUN: 13 mg/dL (ref 8–23)
CO2: 24 mmol/L (ref 22–32)
Calcium: 9.9 mg/dL (ref 8.9–10.3)
Chloride: 103 mmol/L (ref 98–111)
Creatinine, Ser: 0.8 mg/dL (ref 0.44–1.00)
GFR, Estimated: >60 mL/min (ref >60)
Glucose, Bld: 173 mg/dL — ABNORMAL HIGH (ref 70–99)
Potassium: 4.1 mmol/L (ref 3.5–5.1)
Sodium: 140 mmol/L (ref 135–145)
Total Bilirubin: 0.5 mg/dL (ref 0.3–1.2)
Total Protein: 7.7 g/dL (ref 6.5–8.1)

## 2020-04-29 LAB — CBC WITH DIFFERENTIAL/PLATELET
Abs Immature Granulocytes: 0.06 10*3/uL (ref 0.00–0.07)
Basophils Absolute: 0.1 10*3/uL (ref 0.0–0.1)
Basophils Relative: 1 %
Eosinophils Absolute: 0 10*3/uL (ref 0.0–0.5)
Eosinophils Relative: 0 %
HCT: 48.2 % — ABNORMAL HIGH (ref 36.0–46.0)
Hemoglobin: 16.5 g/dL — ABNORMAL HIGH (ref 12.0–15.0)
Immature Granulocytes: 0 %
Lymphocytes Relative: 5 %
Lymphs Abs: 0.9 10*3/uL (ref 0.7–4.0)
MCH: 29.3 pg (ref 26.0–34.0)
MCHC: 34.2 g/dL (ref 30.0–36.0)
MCV: 85.5 fL (ref 80.0–100.0)
Monocytes Absolute: 0.8 10*3/uL (ref 0.1–1.0)
Monocytes Relative: 4 %
Neutro Abs: 17.1 10*3/uL — ABNORMAL HIGH (ref 1.7–7.7)
Neutrophils Relative %: 90 %
Platelets: 277 10*3/uL (ref 150–400)
RBC: 5.64 MIL/uL — ABNORMAL HIGH (ref 3.87–5.11)
RDW: 13.9 % (ref 11.5–15.5)
WBC: 19.1 10*3/uL — ABNORMAL HIGH (ref 4.0–10.5)
nRBC: 0 % (ref 0.0–0.2)

## 2020-04-29 LAB — URINALYSIS, MICROSCOPIC (REFLEX): RBC / HPF: NONE SEEN RBC/hpf (ref 0–5)

## 2020-04-29 LAB — URINALYSIS, ROUTINE W REFLEX MICROSCOPIC
Bilirubin Urine: NEGATIVE
Glucose, UA: NEGATIVE mg/dL
Hgb urine dipstick: NEGATIVE
Ketones, ur: 15 mg/dL — AB
Nitrite: NEGATIVE
Protein, ur: 100 mg/dL — AB
Specific Gravity, Urine: >1.03 — ABNORMAL HIGH (ref 1.005–1.030)
pH: 5.5 (ref 5.0–8.0)

## 2020-04-29 LAB — RESP PANEL BY RT-PCR (FLU A&B, COVID) ARPGX2
Influenza A by PCR: NEGATIVE
Influenza B by PCR: NEGATIVE
SARS Coronavirus 2 by RT PCR: NEGATIVE

## 2020-04-29 LAB — GLUCOSE, CAPILLARY: Glucose-Capillary: 133 mg/dL — ABNORMAL HIGH (ref 70–99)

## 2020-04-29 LAB — LIPASE, BLOOD: Lipase: 54 U/L — ABNORMAL HIGH (ref 11–51)

## 2020-04-29 MED ORDER — ENOXAPARIN SODIUM 40 MG/0.4ML ~~LOC~~ SOLN
40.0000 mg | SUBCUTANEOUS | Status: DC
  Administered 2020-04-30: 40 mg via SUBCUTANEOUS
  Filled 2020-04-29: qty 0.4

## 2020-04-29 MED ORDER — ONDANSETRON HCL 4 MG/2ML IJ SOLN
4.0000 mg | Freq: Once | INTRAMUSCULAR | Status: AC
  Administered 2020-04-29: 4 mg via INTRAVENOUS
  Filled 2020-04-29: qty 2

## 2020-04-29 MED ORDER — PANTOPRAZOLE SODIUM 40 MG IV SOLR
40.0000 mg | INTRAVENOUS | Status: DC
  Administered 2020-04-29 – 2020-04-30 (×2): 40 mg via INTRAVENOUS
  Filled 2020-04-29 (×2): qty 40

## 2020-04-29 MED ORDER — MORPHINE SULFATE (PF) 4 MG/ML IV SOLN: 4.0000 mg | INTRAVENOUS | Status: DC | PRN

## 2020-04-29 MED ORDER — MORPHINE SULFATE (PF) 2 MG/ML IV SOLN
2.0000 mg | INTRAVENOUS | Status: DC | PRN
  Administered 2020-04-29 – 2020-04-30 (×3): 2 mg via INTRAVENOUS
  Filled 2020-04-29 (×4): qty 1

## 2020-04-29 MED ORDER — LORAZEPAM 2 MG/ML IJ SOLN
1.0000 mg | Freq: Once | INTRAMUSCULAR | Status: AC
  Administered 2020-04-29: 1 mg via INTRAVENOUS
  Filled 2020-04-29: qty 1

## 2020-04-29 MED ORDER — ACETAMINOPHEN 325 MG PO TABS: 650.0000 mg | ORAL_TABLET | Freq: Four times a day (QID) | ORAL | Status: DC | PRN

## 2020-04-29 MED ORDER — ACETAMINOPHEN 650 MG RE SUPP: 650.0000 mg | Freq: Four times a day (QID) | RECTAL | Status: DC | PRN

## 2020-04-29 MED ORDER — INSULIN ASPART 100 UNIT/ML ~~LOC~~ SOLN: 0.0000 [IU] | Freq: Four times a day (QID) | SUBCUTANEOUS | Status: DC

## 2020-04-29 MED ORDER — MORPHINE SULFATE (PF) 4 MG/ML IV SOLN
4.0000 mg | Freq: Once | INTRAVENOUS | Status: AC
  Administered 2020-04-29: 4 mg via INTRAVENOUS
  Filled 2020-04-29: qty 1

## 2020-04-29 MED ORDER — PIPERACILLIN-TAZOBACTAM 3.375 G IVPB 30 MIN
3.3750 g | Freq: Once | INTRAVENOUS | Status: AC
  Administered 2020-04-29: 3.375 g via INTRAVENOUS
  Filled 2020-04-29: qty 50

## 2020-04-29 MED ORDER — PIPERACILLIN-TAZOBACTAM 3.375 G IVPB
3.3750 g | Freq: Three times a day (TID) | INTRAVENOUS | Status: DC
  Administered 2020-04-29: 3.375 g via INTRAVENOUS
  Filled 2020-04-29: qty 50

## 2020-04-29 MED ORDER — LACTATED RINGERS IV SOLN: INTRAVENOUS | Status: AC

## 2020-04-29 MED ORDER — IOHEXOL 300 MG/ML  SOLN
100.0000 mL | Freq: Once | INTRAMUSCULAR | Status: AC | PRN
  Administered 2020-04-29: 100 mL via INTRAVENOUS

## 2020-04-29 MED ORDER — SODIUM CHLORIDE 0.9 % IV BOLUS
1000.0000 mL | Freq: Once | INTRAVENOUS | Status: AC
  Administered 2020-04-29: 1000 mL via INTRAVENOUS

## 2020-04-29 MED ORDER — FENTANYL CITRATE (PF) 100 MCG/2ML IJ SOLN
50.0000 ug | Freq: Once | INTRAMUSCULAR | Status: AC
  Administered 2020-04-29: 50 ug via INTRAVENOUS
  Filled 2020-04-29: qty 2

## 2020-04-29 MED ORDER — HYDRALAZINE HCL 20 MG/ML IJ SOLN: 10.0000 mg | Freq: Four times a day (QID) | INTRAMUSCULAR | Status: DC | PRN

## 2020-04-29 MED ORDER — ONDANSETRON HCL 4 MG/2ML IJ SOLN: 4.0000 mg | Freq: Four times a day (QID) | INTRAMUSCULAR | Status: DC | PRN

## 2020-04-29 NOTE — ED Notes (Signed)
Pt on auto VS  

## 2020-04-29 NOTE — ED Notes (Signed)
ED Provider at bedside. 

## 2020-04-29 NOTE — ED Triage Notes (Addendum)
C/o lower pelvic pain with n/v x 2 days, pt is pale and diaphoretic in triage pt is moved to hall way 13 charge made aware , pa at bedside

## 2020-04-29 NOTE — H&P (Signed)
History and Physical    Aberdeen Mefferd UDJ:[id number] DOB: [birth year] DOA: 04/29/2020  PCP: Tempie Hoist, MD  Patient coming from: [facility] transfer   Chief Complaint:  Chief Complaint  Patient presents with  . Abdominal Pain     HPI:    75 year old female with past medical history of hypertension, gastroesophageal reflux disease, hyperlipidemia, diabetes mellitus type 2, diabetic neuropathy, small bowel obstruction 01/2020 who presents to [facility] emergency department with sudden onset abdominal pain nausea and vomiting.  Patient claims that the evening before presentation she woke from sleep with sudden abdominal pain.  Patient describes abdominal pain is sharp in quality severe in intensity, located in the lower abdomen.  Pain is nonradiating worse with movement and improved with rest.  Pain continued to persist throughout the evening.  The following day, patient continued to experience similar symptoms continue to wax and wane throughout the day.  This was associated with intense nausea and frequent bouts of vomiting with inability to tolerate oral intake.  Patient eventually presented to [facility] emergency department for evaluation.  Upon evaluation in the emergency department CT imaging of the abdomen and pelvis revealed small bowel obstruction with a transition point in the right lower quadrant.  NG tube was placed and set to low intermittent suction.  Patient was initiated on intravenous fluids and intravenous opiate-based analgesics were provided.  Gerkin with general surgery who stated that they would consult on the patient the following morning.  The hospitalist group was then called to assess the patient for mission to the hospital.  Review of Systems:   Review of Systems  Gastrointestinal: Positive for abdominal pain, nausea and vomiting.  All other systems reviewed and are negative.   Past Medical History:  Diagnosis Date  . Essential  hypertension 04/29/2020  . High cholesterol   . Hypertension     Past Surgical History:  Procedure Laterality Date  . ABDOMINAL HYSTERECTOMY       reports that she has never smoked. She has never used smokeless tobacco. She reports current alcohol use. She reports that she does not use drugs.  Allergies  Allergen Reactions  . Lipitor [Atorvastatin] Other (See Comments)    Muscle cramps  . Metformin And Related Other (See Comments)    Leg pain    Family History  Problem Relation Age of Onset  . Heart disease Neg Hx      Prior to Admission medications   Medication Sig Start Date End Date Taking? Authorizing Provider  amLODipine (NORVASC) 2.5 MG tablet Take 2.5 mg by mouth daily.   Yes [provider]  anastrozole (ARIMIDEX) 1 MG tablet Take 1 mg by mouth daily. 01/10/20  Yes [provider]  B Complex-C (B-COMPLEX WITH VITAMIN C) tablet Take 1 tablet by mouth daily.   Yes [provider]  Biotin 10 MG TABS Take 10 mg by mouth every morning.   Yes [provider]  gabapentin (NEURONTIN) 300 MG capsule Take 300 mg by mouth 2 (two) times daily. 04/17/15  Yes [provider]  GLIPIZIDE XL 5 MG 24 hr tablet Take 5 mg by mouth daily with breakfast.  04/17/15  Yes [provider]  Multiple Vitamin (MULTIVITAMIN WITH MINERALS) TABS tablet Take 1 tablet by mouth daily.   Yes [provider]  omeprazole (PRILOSEC) 20 MG capsule Take 20 mg by mouth daily as needed (heartburn). 04/17/15  Yes [provider]  rosuvastatin (CRESTOR) 10 MG tablet Take 10 mg  by mouth every morning.    Yes [provider]  saccharomyces boulardii (FLORASTOR) 250 MG capsule Take 1 capsule (250 mg total) by mouth 2 (two) times daily. 04/25/15   Elgergawy, Silver Huguenin, MD    Physical Exam: Vitals:   04/29/20 1815 04/29/20 1900 04/29/20 1930 04/29/20 2123  BP: (!) 144/59 140/69 (!) 150/72 (!) 164/80  Pulse: 80 81 82 88  Resp: 20 18 16 17    Temp:    98.6 F (37 C)  TempSrc:    Oral  SpO2: 92% 90% 92% 93%  Weight:      Height:        Constitutional: Awake alert and oriented x3, no associated distress.   Skin: no rashes, no lesions, good skin turgor noted. Eyes: Pupils are equally reactive to light.  No evidence of scleral icterus or conjunctival pallor.  ENMT: NG tube noted to be in place with bilious appearing drainage.  Moist mucous membranes noted.  Posterior pharynx clear of any exudate or lesions.   Neck: normal, supple, no masses, no thyromegaly.  No evidence of jugular venous distension.   Respiratory: clear to auscultation bilaterally, no wheezing, no crackles. Normal respiratory effort. No accessory muscle use.  Cardiovascular: Regular rate and rhythm, no murmurs / rubs / gallops. No extremity edema. 2+ pedal pulses. No carotid bruits.  Chest:   Nontender without crepitus or deformity.   Back:   Nontender without crepitus or deformity. Abdomen: Notable lower abdominal tenderness.  Abdomen is soft however.   No evidence of intra-abdominal masses.  Hypoactive bowel sounds noted. Musculoskeletal: No joint deformity upper and lower extremities. Good ROM, no contractures. Normal muscle tone.  Neurologic: CN 2-12 grossly intact. Sensation intact.  Patient moving all 4 extremities spontaneously.  Patient is following all commands.  Patient is responsive to verbal stimuli.   Psychiatric: Patient exhibits normal mood with appropriate affect.  Patient seems to possess insight as to their current situation.     Labs on Admission: I have personally reviewed following labs and imaging studies -   CBC: Recent Labs  Lab 04/29/20 1659  WBC 19.1*  NEUTROABS 17.1*  HGB 16.5*  HCT 48.2*  MCV 85.5  PLT 417   Basic Metabolic Panel: Recent Labs  Lab 04/29/20 1659  NA 140  K 4.1  CL 103  CO2 24  GLUCOSE 173*  BUN 13  CREATININE 0.80  CALCIUM 9.9   GFR: Estimated Creatinine Clearance: 72.1 mL/min (by C-G formula  based on SCr of 0.8 mg/dL). Liver Function Tests: Recent Labs  Lab 04/29/20 1659  AST 35  ALT 26  ALKPHOS 95  BILITOT 0.5  PROT 7.7  ALBUMIN 4.4   Recent Labs  Lab 04/29/20 1659  LIPASE 54*   No results for input(s): AMMONIA in the last 168 hours. Coagulation Profile: No results for input(s): INR, PROTIME in the last 168 hours. Cardiac Enzymes: No results for input(s): CKTOTAL, CKMB, CKMBINDEX, TROPONINI in the last 168 hours. BNP (last 3 results) No results for input(s): PROBNP in the last 8760 hours. HbA1C: No results for input(s): HGBA1C in the last 72 hours. CBG: No results for input(s): GLUCAP in the last 168 hours. Lipid Profile: No results for input(s): CHOL, HDL, LDLCALC, TRIG, CHOLHDL, LDLDIRECT in the last 72 hours. Thyroid Function Tests: No results for input(s): TSH, T4TOTAL, FREET4, T3FREE, THYROIDAB in the last 72 hours. Anemia Panel: No results for input(s): VITAMINB12, FOLATE, FERRITIN, TIBC, IRON, RETICCTPCT in the last 72 hours. Urine analysis:  Component Value Date/Time   COLORURINE YELLOW 04/29/2020 1608   APPEARANCEUR CLOUDY (A) 04/29/2020 1608   LABSPEC >1.030 (H) 04/29/2020 1608   PHURINE 5.5 04/29/2020 1608   GLUCOSEU NEGATIVE 04/29/2020 1608   HGBUR NEGATIVE 04/29/2020 1608   BILIRUBINUR NEGATIVE 04/29/2020 1608   KETONESUR 15 (A) 04/29/2020 1608   PROTEINUR 100 (A) 04/29/2020 1608   UROBILINOGEN 0.2 09/09/2013 2142   NITRITE NEGATIVE 04/29/2020 1608   LEUKOCYTESUR TRACE (A) 04/29/2020 1608    Radiological Exams on Admission - Personally Reviewed: CT Abdomen Pelvis W Contrast  Result Date: 04/29/2020 CLINICAL DATA:  Abdominal pain.  Nausea and vomiting. EXAM: CT ABDOMEN AND PELVIS WITH CONTRAST TECHNIQUE: Multidetector CT imaging of the abdomen and pelvis was performed using the standard protocol following bolus administration of intravenous contrast. CONTRAST:  111mL OMNIPAQUE IOHEXOL 300 MG/ML  SOLN COMPARISON:  CT scan 02/16/2020  FINDINGS: Lower chest: Minimal dependent bibasilar atelectasis. No infiltrates or effusions. No pulmonary lesions. The heart is normal in size. No pericardial effusion. Hepatobiliary: Stable low-attenuation left hepatic lobe lesion consistent with benign cysts. No worrisome hepatic lesions or intrahepatic biliary dilatation. A small gallstone is noted in the gallbladder. No findings for acute cholecystitis. No common bile duct dilatation. Pancreas: No mass, inflammation or ductal dilatation. Spleen: Normal size.  No focal lesions. Adrenals/Urinary Tract: Adrenal glands are normal. Upper pole left renal calculus but no obstructing ureteral calculi or bladder calculi. No worrisome renal or bladder lesions. Stomach/Bowel: The stomach is moderately distended and fluid-filled. The small bowel is also distended with fluid along with scattered air-fluid levels consistent with obstruction. There is a transition to nondilated/decompressed distal ileal loops of small bowel in the right lower quadrant likely due to adhesions. The colon is diffusely decompressed. There is scattered diffuse colonic diverticulosis. Vascular/Lymphatic: Moderate atherosclerotic calcifications involving the aorta and branch vessels. No aneurysm or dissection. The major venous structures are patent. No mesenteric or retroperitoneal mass or adenopathy. Reproductive: Surgically absent. Other: No pelvic mass or adenopathy. No free pelvic fluid collections. No inguinal mass or adenopathy. No abdominal wall hernia or subcutaneous lesions. Musculoskeletal: No significant bony findings. Age related degenerative changes involving the spine. 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 CT findings consistent with a small bowel obstruction with the transition point in the right lower quadrant. This is likely due to adhesions. No mass or other cause is identified. 2. Cholelithiasis. 3. Upper pole left  renal calculus but no obstructing ureteral calculi or bladder calculi. 4. Advanced atherosclerotic calcifications involving the aorta and branch vessels. 5. 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 Aortic Atherosclerosis (ICD10-I70.0). Electronically Signed   By: Marijo Sanes M.D.   On: 04/29/2020 18:33   DG Abd Portable 1V  Result Date: 04/29/2020 CLINICAL DATA:  NG tube insertion EXAM: PORTABLE ABDOMEN - 1 VIEW COMPARISON:  CT 04/29/2020, radiograph 02/17/2020 FINDINGS: Esophageal tube tip and side port overlie the proximal to mid stomach. Excreted contrast within the renal collecting systems. IMPRESSION: Esophageal tube tip overlies the mid stomach Electronically Signed   By: Donavan Foil M.D.   On: 04/29/2020 20:22    EKG: Personally reviewed.  Rhythm is normal sinus rhythm with heart rate of 86 bpm.  No dynamic ST segment changes appreciated.  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  NG tube is been placed and set to low  intermittent suction  Hydrating patient with intravenous isotonic fluids.  Patient is currently n.p.o.  As needed opiate-based analgesics for associated pain  Daily abdominal imaging  Dr. Harlow Asa with general surgery was contacted by the emergency department staff who stated that his team will consult on the patient this morning.  Patient may benefit from a low residue diet at time of discharge to minimize recurrence.  Active Problems:   Essential hypertension   Holding oral antihypertensives at this time  As needed intravenous interventions for markedly elevated blood pressure.    Type 2 diabetes mellitus with diabetic polyneuropathy, without long-term current use of insulin (HCC)   Accu-Cheks every 6 hours with signs scale insulin  Hemoglobin A1c pending    Mixed diabetic hyperlipidemia associated with  type 2 diabetes mellitus (HCC)   Holding statin therapy for now, this can be resumed once patient is able to take p.o. intake.    GERD without esophagitis  Intravenous Protonix daily while NPO.    Code Status:  Full code Family Communication: deferred   Status is: Inpatient  Remains inpatient appropriate because:Ongoing diagnostic testing needed not appropriate for outpatient work up, IV treatments appropriate due to intensity of illness or inability to take PO and Inpatient level of care appropriate due to severity of illness   Dispo: The patient is from: Home              Anticipated d/c is to: Home              Patient currently is not medically stable to d/c.   Difficult to place patient No        Vernelle Emerald MD [facility] Pager [phone]  If 7PM-7AM, please contact night-coverage [email or web address] Use universal [place] password for that web site. If you do not have the password, please call the hospital operator.  04/29/2020, 10:21 PM

## 2020-04-29 NOTE — ED Provider Notes (Signed)
[facility] EMERGENCY DEPARTMENT Provider Note   CSN: [id number] Arrival date & time: 04/29/20  1431     History Chief Complaint  Patient presents with  . Abdominal Pain    Tabia Stohr is a 75 y.o. female hospital history of small bowel obstruction, high cholesterol, hypertension who presents for evaluation of abdominal pain that began last night.  She reports that it has waxed and waned since onset last night.  She states is mostly in the suprapubic, right lower abdomen.  States it is progressively worsened.  She reports associated nausea, diaphoresis.  Pain does not radiate.  She has not had any diarrhea.  She denies any dysuria, hematuria.  She has not noted any fever.  She has had an abdominal hysterectomy.  No other surgeries.  She has had history of kidney stones previously but states it has been several years since she had any.  She does not recall if this feels similar to any previous kidney stones.  She denies any chest pain, difficulty breathing.  The history is provided by the patient.       Past Medical History:  Diagnosis Date  . Essential hypertension 04/29/2020  . High cholesterol   . Hypertension     Patient Active Problem List   Diagnosis Date Noted  . Essential hypertension 04/29/2020  . Type 2 diabetes mellitus with diabetic polyneuropathy, without long-term current use of insulin ([place]) 04/29/2020  . Mixed diabetic hyperlipidemia associated with type 2 diabetes mellitus ([place]) 04/29/2020  . GERD without esophagitis 04/29/2020  . Small bowel obstruction ([place]) 02/16/2020  . SBO (small bowel obstruction) ([place]) 02/16/2020  . Malignant neoplasm of upper-inner quadrant of right breast in female, estrogen receptor positive ([place]) 10/03/2018  . Adenomatous polyp of transverse colon 05/07/2016  . Abdominal pain 04/23/2015  . Ileitis 04/23/2015  . AP (abdominal pain)   . Nausea with vomiting   . Diverticulosis of large intestine 11/14/2006    Past  Surgical History:  Procedure Laterality Date  . ABDOMINAL HYSTERECTOMY       OB History   No obstetric history on file.     Family History  Problem Relation Age of Onset  . Heart disease Neg Hx     Social History   Tobacco Use  . Smoking status: Never Smoker  . Smokeless tobacco: Never Used  Substance Use Topics  . Alcohol use: Yes    Comment: social  . Drug use: Never    Home Medications Prior to Admission medications   Medication Sig Start Date End Date Taking? Authorizing Provider  amLODipine (NORVASC) 2.5 MG tablet Take 2.5 mg by mouth daily.   Yes [provider]  anastrozole (ARIMIDEX) 1 MG tablet Take 1 mg by mouth daily. 01/10/20  Yes [provider]  B Complex-C (B-COMPLEX WITH VITAMIN C) tablet Take 1 tablet by mouth daily.   Yes [provider]  Biotin 10 MG TABS Take 10 mg by mouth every morning.   Yes [provider]  gabapentin (NEURONTIN) 300 MG capsule Take 300 mg by mouth 2 (two) times daily. 04/17/15  Yes [provider]  GLIPIZIDE XL 5 MG 24 hr tablet Take 5 mg by mouth daily with breakfast.  04/17/15  Yes [provider]  Multiple Vitamin (MULTIVITAMIN WITH MINERALS) TABS tablet Take 1 tablet by mouth daily.   Yes [provider]  omeprazole (PRILOSEC) 20 MG capsule Take 20 mg by mouth daily as needed (heartburn). 04/17/15  Yes [provider]  rosuvastatin (CRESTOR) 10 MG tablet Take 10 mg by mouth every morning.    Yes [provider]  saccharomyces boulardii (FLORASTOR) 250 MG capsule Take 1 capsule (250 mg total) by mouth 2 (two) times daily. 04/25/15   Elgergawy, Silver Huguenin, MD    Allergies    Lipitor [atorvastatin] and Metformin and related  Review of Systems   Review of Systems  Constitutional: Negative for fever.  Respiratory: Negative for cough and shortness of breath.   Cardiovascular: Negative for chest pain.  Gastrointestinal: Positive for abdominal pain and  nausea. Negative for vomiting.  Genitourinary: Negative for dysuria and hematuria.  Neurological: Negative for headaches.  All other systems reviewed and are negative.   Physical Exam Updated Vital Signs BP (!) 164/80 (BP Location: Left Arm)   Pulse 88   Temp 98.6 F (37 C) (Oral)   Resp 17   Ht 5\' 6"  (1.676 m)   Wt 93.4 kg   SpO2 93%   BMI 33.25 kg/m   Physical Exam Vitals and nursing note reviewed.  Constitutional:      Appearance: She is well-developed.     Comments: Appears uncomfortable, diaphoretic.  HENT:     Head: Normocephalic and atraumatic.  Eyes:     General: Lids are normal.     Conjunctiva/sclera: Conjunctivae normal.     Pupils: Pupils are equal, round, and reactive to light.  Cardiovascular:     Rate and Rhythm: Normal rate and regular rhythm.     Pulses: Normal pulses.     Heart sounds: Normal heart sounds. No murmur heard. No friction rub. No gallop.   Pulmonary:     Effort: Pulmonary effort is normal.     Breath sounds: Normal breath sounds.     Comments: Lungs clear to auscultation bilaterally.  Symmetric chest rise.  No wheezing, rales, rhonchi. Abdominal:     Palpations: Abdomen is soft. Abdomen is not rigid.     Tenderness: There is no abdominal tenderness. There is no guarding.     Comments: Abdomen soft nondistended.  Tenderness palpation of the right lower quadrant, suprapubic region.  No rigidity, guarding.  No CVA tenderness noted bilaterally.  Musculoskeletal:        General: Normal range of motion.     Cervical back: Full passive range of motion without pain.  Skin:    General: Skin is warm and dry.     Capillary Refill: Capillary refill takes less than 2 seconds.  Neurological:     Mental Status: She is alert and oriented to person, place, and time.  Psychiatric:        Speech: Speech normal.     ED Results / Procedures / Treatments   Labs (all labs ordered are listed, but only abnormal results are displayed) Labs Reviewed   COMPREHENSIVE METABOLIC PANEL - Abnormal; Notable for the following components:      Result Value   Glucose, Bld 173 (*)    All other components within normal limits  CBC WITH DIFFERENTIAL/PLATELET - Abnormal; Notable for the following components:   WBC 19.1 (*)    RBC 5.64 (*)    Hemoglobin 16.5 (*)    HCT 48.2 (*)    Neutro Abs 17.1 (*)    All other components within normal limits  URINALYSIS, ROUTINE W REFLEX MICROSCOPIC - Abnormal; Notable for the following components:   APPearance CLOUDY (*)    Specific Gravity, Urine >1.030 (*)    Ketones, ur 15 (*)    Protein,  ur 100 (*)    Leukocytes,Ua TRACE (*)    All other components within normal limits  LIPASE, BLOOD - Abnormal; Notable for the following components:   Lipase 54 (*)    All other components within normal limits  URINALYSIS, MICROSCOPIC (REFLEX) - Abnormal; Notable for the following components:   Bacteria, UA FEW (*)    All other components within normal limits  GLUCOSE, CAPILLARY - Abnormal; Notable for the following components:   Glucose-Capillary 133 (*)    All other components within normal limits  RESP PANEL BY RT-PCR (FLU A&B, COVID) ARPGX2  HEMOGLOBIN A1C  COMPREHENSIVE METABOLIC PANEL  MAGNESIUM  CBC WITH DIFFERENTIAL/PLATELET    EKG None  Radiology CT Abdomen Pelvis W Contrast  Result Date: 04/29/2020 CLINICAL DATA:  Abdominal pain.  Nausea and vomiting. EXAM: CT ABDOMEN AND PELVIS WITH CONTRAST TECHNIQUE: Multidetector CT imaging of the abdomen and pelvis was performed using the standard protocol following bolus administration of intravenous contrast. CONTRAST:  133mL OMNIPAQUE IOHEXOL 300 MG/ML  SOLN COMPARISON:  CT scan 02/16/2020 FINDINGS: Lower chest: Minimal dependent bibasilar atelectasis. No infiltrates or effusions. No pulmonary lesions. The heart is normal in size. No pericardial effusion. Hepatobiliary: Stable low-attenuation left hepatic lobe lesion consistent with benign cysts. No worrisome  hepatic lesions or intrahepatic biliary dilatation. A small gallstone is noted in the gallbladder. No findings for acute cholecystitis. No common bile duct dilatation. Pancreas: No mass, inflammation or ductal dilatation. Spleen: Normal size.  No focal lesions. Adrenals/Urinary Tract: Adrenal glands are normal. Upper pole left renal calculus but no obstructing ureteral calculi or bladder calculi. No worrisome renal or bladder lesions. Stomach/Bowel: The stomach is moderately distended and fluid-filled. The small bowel is also distended with fluid along with scattered air-fluid levels consistent with obstruction. There is a transition to nondilated/decompressed distal ileal loops of small bowel in the right lower quadrant likely due to adhesions. The colon is diffusely decompressed. There is scattered diffuse colonic diverticulosis. Vascular/Lymphatic: Moderate atherosclerotic calcifications involving the aorta and branch vessels. No aneurysm or dissection. The major venous structures are patent. No mesenteric or retroperitoneal mass or adenopathy. Reproductive: Surgically absent. Other: No pelvic mass or adenopathy. No free pelvic fluid collections. No inguinal mass or adenopathy. No abdominal wall hernia or subcutaneous lesions. Musculoskeletal: No significant bony findings. Age related degenerative changes involving the spine. 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 CT findings consistent with a small bowel obstruction with the transition point in the right lower quadrant. This is likely due to adhesions. No mass or other cause is identified. 2. Cholelithiasis. 3. Upper pole left renal calculus but no obstructing ureteral calculi or bladder calculi. 4. Advanced atherosclerotic calcifications involving the aorta and branch vessels. 5. 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 Aortic Atherosclerosis (ICD10-I70.0).  Electronically Signed   By: Marijo Sanes M.D.   On: 04/29/2020 18:33   DG Abd Portable 1V  Result Date: 04/29/2020 CLINICAL DATA:  NG tube insertion EXAM: PORTABLE ABDOMEN - 1 VIEW COMPARISON:  CT 04/29/2020, radiograph 02/17/2020 FINDINGS: Esophageal tube tip and side port overlie the proximal to mid stomach. Excreted contrast within the renal collecting systems. IMPRESSION: Esophageal tube tip overlies the mid stomach Electronically Signed   By: Donavan Foil M.D.   On: 04/29/2020 20:22    Procedures Procedures   Medications Ordered in ED Medications  piperacillin-tazobactam (ZOSYN) IVPB 3.375 g (0 g Intravenous Stopped 04/29/20 1900)  Followed by  piperacillin-tazobactam (ZOSYN) IVPB 3.375 g (3.375 g Intravenous New Bag/Given 04/29/20 2352)  pantoprazole (PROTONIX) injection 40 mg (40 mg Intravenous Given 04/29/20 2354)  hydrALAZINE (APRESOLINE) injection 10 mg (has no administration in time range)  ondansetron (ZOFRAN) injection 4 mg (has no administration in time range)  lactated ringers infusion ( Intravenous New Bag/Given 04/29/20 2250)  insulin aspart (novoLOG) injection 0-15 Units (2 Units Subcutaneous Patient Refused/Not Given 04/29/20 2258)  enoxaparin (LOVENOX) injection 40 mg (has no administration in time range)  acetaminophen (TYLENOL) tablet 650 mg (has no administration in time range)    Or  acetaminophen (TYLENOL) suppository 650 mg (has no administration in time range)  morphine 2 MG/ML injection 2 mg (2 mg Intravenous Given 04/29/20 2354)    Or  morphine 4 MG/ML injection 4 mg ( Intravenous See Alternative 04/29/20 2354)  ondansetron (ZOFRAN) injection 4 mg (4 mg Intravenous Given 04/29/20 1606)  sodium chloride 0.9 % bolus 1,000 mL (0 mLs Intravenous Stopped 04/29/20 1859)  fentaNYL (SUBLIMAZE) injection 50 mcg (50 mcg Intravenous Given 04/29/20 1605)  morphine 4 MG/ML injection 4 mg (4 mg Intravenous Given 04/29/20 1643)  iohexol (OMNIPAQUE) 300 MG/ML solution 100 mL (100 mLs  Intravenous Contrast Given 04/29/20 1753)  ondansetron (ZOFRAN) injection 4 mg (4 mg Intravenous Given 04/29/20 1812)  LORazepam (ATIVAN) injection 1 mg (1 mg Intravenous Given 04/29/20 1929)    ED Course  I have reviewed the triage vital signs and the nursing notes.  Pertinent labs & imaging results that were available during my care of the patient were reviewed by me and considered in my medical decision making (see chart for details).    MDM Rules/Calculators/A&P                          75 year old female presents for evaluation of right lower quadrant abdominal pain that started last night.  Associated nausea/vomiting.  No diarrhea.  Reports her last bowel movement was earlier prior to coming to the ED.  She denies any fevers, chest pain, difficulty breathing.  On initial ED arrival, she is slightly hypertensive with soft blood pressures in the 100s.  She is diaphoretic and appears uncomfortable.  On exam, she has tenderness palpation of the right lower quadrant.  Concern for infectious etiology such as appendicitis versus diverticulitis.  Also concern for possible kidney stone.  Patient has a history.  We will plan for labs, imaging.  Lipase is 54.  CBC shows leukocytosis of 19.1.  Hemoglobin is 16.5.  CMP shows normal BUN and creatinine. UA shows trace leukocytes, bacteria.  Patient started on Zosyn for concern of intra-abdominal infection.  CT abd/pelvis shows SBO with transition point in the RLQ.   Will insert NG tube and consult general surgery.  Discussed with Dr. Harlow Asa (Gen Surg) he will plan to consult with patient.  Agrees with plan for NG tube.  Discussed results with patient.  Patient very anxious about NG tube.  Will give Ativan.  Discussed with Dr. Conan Bowens (hospitalist) who accepts patient for admission.   Portions of this note were generated with [employer]. Dictation errors may occur despite best attempts at proofreading.  Final Clinical Impression(s) / ED  Diagnoses Final diagnoses:  Small bowel obstruction [facility])    Rx / DC Orders ED Discharge Orders    None       Desma Mcgregor 04/29/20 2357    Blanchie Dessert, MD 05/07/20 2350

## 2020-04-30 ENCOUNTER — Inpatient Hospital Stay (HOSPITAL_COMMUNITY): Payer: Medicare Other

## 2020-04-30 DIAGNOSIS — E6609 Other obesity due to excess calories: Secondary | ICD-10-CM

## 2020-04-30 DIAGNOSIS — K219 Gastro-esophageal reflux disease without esophagitis: Secondary | ICD-10-CM | POA: Diagnosis not present

## 2020-04-30 DIAGNOSIS — K56609 Unspecified intestinal obstruction, unspecified as to partial versus complete obstruction: Secondary | ICD-10-CM | POA: Diagnosis not present

## 2020-04-30 DIAGNOSIS — Z6833 Body mass index (BMI) 33.0-33.9, adult: Secondary | ICD-10-CM

## 2020-04-30 DIAGNOSIS — E1142 Type 2 diabetes mellitus with diabetic polyneuropathy: Secondary | ICD-10-CM | POA: Diagnosis not present

## 2020-04-30 DIAGNOSIS — D72825 Bandemia: Secondary | ICD-10-CM

## 2020-04-30 DIAGNOSIS — I10 Essential (primary) hypertension: Secondary | ICD-10-CM | POA: Diagnosis not present

## 2020-04-30 LAB — GLUCOSE, CAPILLARY
Glucose-Capillary: 115 mg/dL — ABNORMAL HIGH (ref 70–99)
Glucose-Capillary: 91 mg/dL (ref 70–99)
Glucose-Capillary: 94 mg/dL (ref 70–99)

## 2020-04-30 LAB — COMPREHENSIVE METABOLIC PANEL
ALT: 19 U/L (ref 0–44)
AST: 27 U/L (ref 15–41)
Albumin: 3.6 g/dL (ref 3.5–5.0)
Alkaline Phosphatase: 75 U/L (ref 38–126)
Anion gap: 13 (ref 5–15)
BUN: 14 mg/dL (ref 8–23)
CO2: 20 mmol/L — ABNORMAL LOW (ref 22–32)
Calcium: 8.9 mg/dL (ref 8.9–10.3)
Chloride: 110 mmol/L (ref 98–111)
Creatinine, Ser: 0.83 mg/dL (ref 0.44–1.00)
GFR, Estimated: >60 mL/min (ref >60)
Glucose, Bld: 73 mg/dL (ref 70–99)
Potassium: 4.2 mmol/L (ref 3.5–5.1)
Sodium: 143 mmol/L (ref 135–145)
Total Bilirubin: 0.7 mg/dL (ref 0.3–1.2)
Total Protein: 6.3 g/dL — ABNORMAL LOW (ref 6.5–8.1)

## 2020-04-30 LAB — CBC WITH DIFFERENTIAL/PLATELET
Abs Immature Granulocytes: 0.04 10*3/uL (ref 0.00–0.07)
Basophils Absolute: 0.1 10*3/uL (ref 0.0–0.1)
Basophils Relative: 0 %
Eosinophils Absolute: 0.1 10*3/uL (ref 0.0–0.5)
Eosinophils Relative: 1 %
HCT: 41.2 % (ref 36.0–46.0)
Hemoglobin: 13.5 g/dL (ref 12.0–15.0)
Immature Granulocytes: 0 %
Lymphocytes Relative: 17 %
Lymphs Abs: 2.1 10*3/uL (ref 0.7–4.0)
MCH: 28.8 pg (ref 26.0–34.0)
MCHC: 32.8 g/dL (ref 30.0–36.0)
MCV: 87.8 fL (ref 80.0–100.0)
Monocytes Absolute: 1 10*3/uL (ref 0.1–1.0)
Monocytes Relative: 8 %
Neutro Abs: 9.4 10*3/uL — ABNORMAL HIGH (ref 1.7–7.7)
Neutrophils Relative %: 74 %
Platelets: 194 10*3/uL (ref 150–400)
RBC: 4.69 MIL/uL (ref 3.87–5.11)
RDW: 14.4 % (ref 11.5–15.5)
WBC: 12.7 10*3/uL — ABNORMAL HIGH (ref 4.0–10.5)
nRBC: 0 % (ref 0.0–0.2)

## 2020-04-30 LAB — MAGNESIUM: Magnesium: 2 mg/dL (ref 1.7–2.4)

## 2020-04-30 LAB — HEMOGLOBIN A1C
Hgb A1c MFr Bld: 6.1 % — ABNORMAL HIGH (ref 4.8–5.6)
Mean Plasma Glucose: 128.37 mg/dL

## 2020-04-30 MED ORDER — PHENOL 1.4 % MT LIQD
1.0000 | OROMUCOSAL | Status: DC | PRN
  Filled 2020-04-30: qty 177

## 2020-04-30 MED ORDER — DIATRIZOATE MEGLUMINE & SODIUM 66-10 % PO SOLN
90.0000 mL | Freq: Once | ORAL | Status: AC
  Administered 2020-04-30: 90 mL via NASOGASTRIC
  Filled 2020-04-30: qty 90

## 2020-04-30 NOTE — Consult Note (Signed)
Reason for Consult:sbo Referring Physician: Dr Ernestina Patches [name] is an 75 y.o. female.  HPI: 52 yof with pmh of gerd, hysterectomy/appy in 1980s who was admitted 1/25-1/27 for sbo that resolved quickly. She reports over 24 hours of abdominal pain, no flatus, n/v.  No bm.  She has no fevers.  She has prior csc.  She was admitted overnight and had ng placed.  She is much better this am.  She is now passing some flatus.    Past Medical History:  Diagnosis Date  . Essential hypertension 04/29/2020  . High cholesterol   . Hypertension     Past Surgical History:  Procedure Laterality Date  . ABDOMINAL HYSTERECTOMY      Family History  Problem Relation Age of Onset  . Heart disease Neg Hx     Social History:  reports that she has never smoked. She has never used smokeless tobacco. She reports current alcohol use. She reports that she does not use drugs.  Allergies:  Allergies  Allergen Reactions  . Lipitor [Atorvastatin] Other (See Comments)    Muscle cramps  . Metformin And Related Other (See Comments)    Leg pain    Medications: I have reviewed the patient's current medications.  Results for orders placed or performed during the hospital encounter of 04/29/20 (from the past 48 hour(s))  Urinalysis, Routine w reflex microscopic Urine, Clean Catch     Status: Abnormal   Collection Time: 04/29/20  4:08 PM  Result Value Ref Range   Color, Urine YELLOW YELLOW   APPearance CLOUDY (A) CLEAR   Specific Gravity, Urine >1.030 (H) 1.005 - 1.030   pH 5.5 5.0 - 8.0   Glucose, UA NEGATIVE NEGATIVE mg/dL   Hgb urine dipstick NEGATIVE NEGATIVE   Bilirubin Urine NEGATIVE NEGATIVE   Ketones, ur 15 (A) NEGATIVE mg/dL   Protein, ur 100 (A) NEGATIVE mg/dL   Nitrite NEGATIVE NEGATIVE   Leukocytes,Ua TRACE (A) NEGATIVE    Comment: Performed at [facility], [address] [place], [place] [address]  Urinalysis, Microscopic (reflex)     Status: Abnormal   Collection Time:  04/29/20  4:08 PM  Result Value Ref Range   RBC / HPF NONE SEEN 0 - 5 RBC/hpf   WBC, UA 0-5 0 - 5 WBC/hpf   Bacteria, UA FEW (A) NONE SEEN   Squamous Epithelial / LPF 6-10 0 - 5   Ca Oxalate Crys, UA PRESENT     Comment: Performed at [facility], [address] [place], [place] [address]  Comprehensive metabolic panel     Status: Abnormal   Collection Time: 04/29/20  4:59 PM  Result Value Ref Range   Sodium 140 135 - 145 mmol/L   Potassium 4.1 3.5 - 5.1 mmol/L   Chloride 103 98 - 111 mmol/L   CO2 24 22 - 32 mmol/L   Glucose, Bld 173 (H) 70 - 99 mg/dL    Comment: Glucose reference range applies only to samples taken after fasting for at least 8 hours.   BUN 13 8 - 23 mg/dL   Creatinine, Ser 0.80 0.44 - 1.00 mg/dL   Calcium 9.9 8.9 - 10.3 mg/dL   Total Protein 7.7 6.5 - 8.1 g/dL   Albumin 4.4 3.5 - 5.0 g/dL   AST 35 15 - 41 U/L   ALT 26 0 - 44 U/L   Alkaline Phosphatase 95 38 - 126 U/L   Total Bilirubin 0.5 0.3 - 1.2  mg/dL   GFR, Estimated >60 >60 mL/min    Comment: (NOTE) Calculated using the CKD-EPI Creatinine Equation (2021)    Anion gap 13 5 - 15    Comment: Performed at [facility], [address] [place], [place] [address]  CBC with Differential     Status: Abnormal   Collection Time: 04/29/20  4:59 PM  Result Value Ref Range   WBC 19.1 (H) 4.0 - 10.5 K/uL   RBC 5.64 (H) 3.87 - 5.11 MIL/uL   Hemoglobin 16.5 (H) 12.0 - 15.0 g/dL   HCT 48.2 (H) 36.0 - 46.0 %   MCV 85.5 80.0 - 100.0 fL   MCH 29.3 26.0 - 34.0 pg   MCHC 34.2 30.0 - 36.0 g/dL   RDW 13.9 11.5 - 15.5 %   Platelets 277 150 - 400 K/uL   nRBC 0.0 0.0 - 0.2 %   Neutrophils Relative % 90 %   Neutro Abs 17.1 (H) 1.7 - 7.7 K/uL   Lymphocytes Relative 5 %   Lymphs Abs 0.9 0.7 - 4.0 K/uL   Monocytes Relative 4 %   Monocytes Absolute 0.8 0.1 - 1.0 K/uL   Eosinophils Relative 0 %   Eosinophils Absolute 0.0 0.0 - 0.5 K/uL   Basophils Relative 1 %   Basophils Absolute 0.1 0.0 - 0.1 K/uL    Immature Granulocytes 0 %   Abs Immature Granulocytes 0.06 0.00 - 0.07 K/uL    Comment: Performed at [facility], [address] [place], [place] [address]  Lipase, blood     Status: Abnormal   Collection Time: 04/29/20  4:59 PM  Result Value Ref Range   Lipase 54 (H) 11 - 51 U/L    Comment: Performed at [facility], [address] [place], [place] [address]  Resp Panel by RT-PCR (Flu A&B, Covid) Nasopharyngeal Swab     Status: None   Collection Time: 04/29/20  7:24 PM   Specimen: Nasopharyngeal Swab; Nasopharyngeal(NP) swabs in vial transport medium  Result Value Ref Range   SARS Coronavirus 2 by RT PCR NEGATIVE NEGATIVE    Comment: (NOTE) SARS-CoV-2 target nucleic acids are NOT DETECTED.  The SARS-CoV-2 RNA is generally detectable in upper respiratory specimens during the acute phase of infection. The lowest concentration of SARS-CoV-2 viral copies this assay can detect is 138 copies/mL. A negative result does not preclude SARS-Cov-2 infection and should not be used as the sole basis for treatment or other patient management decisions. A negative result may occur with  improper specimen collection/handling, submission of specimen other than nasopharyngeal swab, presence of viral mutation(s) within the areas targeted by this assay, and inadequate number of viral copies(<138 copies/mL). A negative result must be combined with clinical observations, patient history, and epidemiological information. The expected result is Negative.  Fact Sheet for Patients:  EntrepreneurPulse.com.au  Fact Sheet for Healthcare Providers:  IncredibleEmployment.be  This test is no t yet approved or cleared by the [place] FDA and  has been authorized for detection and/or diagnosis of SARS-CoV-2 by FDA under an Emergency Use Authorization (EUA). This EUA will remain  in effect (meaning this test can be used) for the duration of  the COVID-19 declaration under Section 564(b)(1) of the Act, 21 U.S.C.section 360bbb-3(b)(1), unless the authorization is terminated  or revoked sooner.       Influenza A by PCR NEGATIVE NEGATIVE   Influenza B by PCR NEGATIVE NEGATIVE    Comment: (NOTE) The  Xpert Xpress SARS-CoV-2/FLU/RSV plus assay is intended as an aid in the diagnosis of influenza from Nasopharyngeal swab specimens and should not be used as a sole basis for treatment. Nasal washings and aspirates are unacceptable for Xpert Xpress SARS-CoV-2/FLU/RSV testing.  Fact Sheet for Patients: EntrepreneurPulse.com.au  Fact Sheet for Healthcare Providers: IncredibleEmployment.be  This test is not yet approved or cleared by the [place] FDA and has been authorized for detection and/or diagnosis of SARS-CoV-2 by FDA under an Emergency Use Authorization (EUA). This EUA will remain in effect (meaning this test can be used) for the duration of the COVID-19 declaration under Section 564(b)(1) of the Act, 21 U.S.C. section 360bbb-3(b)(1), unless the authorization is terminated or revoked.  Performed at [facility], [address] [place], [place] [address]   Glucose, capillary     Status: Abnormal   Collection Time: 04/29/20 10:57 PM  Result Value Ref Range   Glucose-Capillary 133 (H) 70 - 99 mg/dL    Comment: Glucose reference range applies only to samples taken after fasting for at least 8 hours.  Hemoglobin A1c     Status: Abnormal   Collection Time: 04/30/20  4:26 AM  Result Value Ref Range   Hgb A1c MFr Bld 6.1 (H) 4.8 - 5.6 %    Comment: (NOTE) Pre diabetes:          5.7%-6.4%  Diabetes:              >6.4%  Glycemic control for   <7.0% adults with diabetes    Mean Plasma Glucose 128.37 mg/dL    Comment: Performed at [facility] [place] [address], [place], [place] [address]  Comprehensive metabolic panel     Status: Abnormal   Collection Time:  04/30/20  4:26 AM  Result Value Ref Range   Sodium 143 135 - 145 mmol/L   Potassium 4.2 3.5 - 5.1 mmol/L   Chloride 110 98 - 111 mmol/L   CO2 20 (L) 22 - 32 mmol/L   Glucose, Bld 73 70 - 99 mg/dL    Comment: Glucose reference range applies only to samples taken after fasting for at least 8 hours.   BUN 14 8 - 23 mg/dL   Creatinine, Ser 0.83 0.44 - 1.00 mg/dL   Calcium 8.9 8.9 - 10.3 mg/dL   Total Protein 6.3 (L) 6.5 - 8.1 g/dL   Albumin 3.6 3.5 - 5.0 g/dL   AST 27 15 - 41 U/L   ALT 19 0 - 44 U/L   Alkaline Phosphatase 75 38 - 126 U/L   Total Bilirubin 0.7 0.3 - 1.2 mg/dL   GFR, Estimated >60 >60 mL/min    Comment: (NOTE) Calculated using the CKD-EPI Creatinine Equation (2021)    Anion gap 13 5 - 15    Comment: Performed at [facility], [place] [address] [place], [place] [address]  Magnesium     Status: None   Collection Time: 04/30/20  4:26 AM  Result Value Ref Range   Magnesium 2.0 1.7 - 2.4 mg/dL    Comment: Performed at [facility], [place] [address], [place], [place] [address]  CBC WITH DIFFERENTIAL     Status: Abnormal   Collection Time: 04/30/20  4:26 AM  Result Value Ref Range   WBC 12.7 (H) 4.0 - 10.5 K/uL   RBC 4.69 3.87 - 5.11 MIL/uL   Hemoglobin 13.5 12.0 - 15.0 g/dL   HCT 41.2 36.0 - 46.0 %  MCV 87.8 80.0 - 100.0 fL   MCH 28.8 26.0 - 34.0 pg   MCHC 32.8 30.0 - 36.0 g/dL   RDW 14.4 11.5 - 15.5 %   Platelets 194 150 - 400 K/uL    Comment: SPECIMEN CHECKED FOR CLOTS REPEATED TO VERIFY PLATELET COUNT CONFIRMED BY SMEAR    nRBC 0.0 0.0 - 0.2 %   Neutrophils Relative % 74 %   Neutro Abs 9.4 (H) 1.7 - 7.7 K/uL   Lymphocytes Relative 17 %   Lymphs Abs 2.1 0.7 - 4.0 K/uL   Monocytes Relative 8 %   Monocytes Absolute 1.0 0.1 - 1.0 K/uL   Eosinophils Relative 1 %   Eosinophils Absolute 0.1 0.0 - 0.5 K/uL   Basophils Relative 0 %   Basophils Absolute 0.1 0.0 - 0.1 K/uL   Immature Granulocytes 0 %   Abs Immature Granulocytes  0.04 0.00 - 0.07 K/uL    Comment: Performed at [facility], [place] [address], [place], [place] [address]    CT Abdomen Pelvis W Contrast  Result Date: 04/29/2020 CLINICAL DATA:  Abdominal pain.  Nausea and vomiting. EXAM: CT ABDOMEN AND PELVIS WITH CONTRAST TECHNIQUE: Multidetector CT imaging of the abdomen and pelvis was performed using the standard protocol following bolus administration of intravenous contrast. CONTRAST:  155mL OMNIPAQUE IOHEXOL 300 MG/ML  SOLN COMPARISON:  CT scan 02/16/2020 FINDINGS: Lower chest: Minimal dependent bibasilar atelectasis. No infiltrates or effusions. No pulmonary lesions. The heart is normal in size. No pericardial effusion. Hepatobiliary: Stable low-attenuation left hepatic lobe lesion consistent with benign cysts. No worrisome hepatic lesions or intrahepatic biliary dilatation. A small gallstone is noted in the gallbladder. No findings for acute cholecystitis. No common bile duct dilatation. Pancreas: No mass, inflammation or ductal dilatation. Spleen: Normal size.  No focal lesions. Adrenals/Urinary Tract: Adrenal glands are normal. Upper pole left renal calculus but no obstructing ureteral calculi or bladder calculi. No worrisome renal or bladder lesions. Stomach/Bowel: The stomach is moderately distended and fluid-filled. The small bowel is also distended with fluid along with scattered air-fluid levels consistent with obstruction. There is a transition to nondilated/decompressed distal ileal loops of small bowel in the right lower quadrant likely due to adhesions. The colon is diffusely decompressed. There is scattered diffuse colonic diverticulosis. Vascular/Lymphatic: Moderate atherosclerotic calcifications involving the aorta and branch vessels. No aneurysm or dissection. The major venous structures are patent. No mesenteric or retroperitoneal mass or adenopathy. Reproductive: Surgically absent. Other: No pelvic mass or adenopathy. No free pelvic  fluid collections. No inguinal mass or adenopathy. No abdominal wall hernia or subcutaneous lesions. Musculoskeletal: No significant bony findings. Age related degenerative changes involving the spine. 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 CT findings consistent with a small bowel obstruction with the transition point in the right lower quadrant. This is likely due to adhesions. No mass or other cause is identified. 2. Cholelithiasis. 3. Upper pole left renal calculus but no obstructing ureteral calculi or bladder calculi. 4. Advanced atherosclerotic calcifications involving the aorta and branch vessels. 5. 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 Aortic Atherosclerosis (ICD10-I70.0). Electronically Signed   By: Marijo Sanes M.D.   On: 04/29/2020 18:33   DG Abd Portable 1V  Result Date: 04/29/2020 CLINICAL DATA:  NG tube insertion EXAM: PORTABLE ABDOMEN - 1 VIEW COMPARISON:  CT 04/29/2020, radiograph 02/17/2020 FINDINGS: Esophageal tube tip and side port overlie the proximal to mid  stomach. Excreted contrast within the renal collecting systems. IMPRESSION: Esophageal tube tip overlies the mid stomach Electronically Signed   By: Donavan Foil M.D.   On: 04/29/2020 20:22    Review of Systems  All other systems reviewed and are negative.  Blood pressure (!) 154/67, pulse 78, temperature 98.7 F (37.1 C), temperature source Oral, resp. rate 16, height 5\' 6"  (1.676 m), weight 93.4 kg, SpO2 92 %. Physical Exam Constitutional:      Appearance: She is well-developed.  Cardiovascular:     Rate and Rhythm: Normal rate and regular rhythm.  Pulmonary:     Effort: Pulmonary effort is normal.  Abdominal:     General: Bowel sounds are decreased.     Palpations: Abdomen is soft.     Tenderness: There is no abdominal tenderness.     Hernia: No hernia is present.  Skin:    General: Skin is warm and dry.      Capillary Refill: Capillary refill takes less than 2 seconds.  Neurological:     General: No focal deficit present.     Mental Status: She is alert.  Psychiatric:        Mood and Affect: Mood normal.        Behavior: Behavior normal.     Assessment/Plan: SBO -this appears to be resolving quickly- I will still do sbo protocol today -if contrast in colon in 8 hours after contrast can dc ng and do clears -this is second obstruction in short period and will have low threshold to put laparoscope in -lovenox fine, scds  Rolm Bookbinder 04/30/2020, 8:54 AM

## 2020-04-30 NOTE — Progress Notes (Signed)
8-hour film from small bowel protocol notes contrast in colon.  This argues against obstruction.  Remove nasogastric tube and start clear liquids tonight.

## 2020-04-30 NOTE — Progress Notes (Signed)
PROGRESS NOTE  Cheryl Stevens EXB:[id number] DOB: Mar 08, 1945   PCP: Tempie Hoist, MD  Patient is from: Home  DOA: 04/29/2020 LOS: 1  Chief complaints: Abdominal pain  Brief Narrative / Interim history: 75 year old F with PMH of SBO, HTN, HLD, GERD and DM-2 presenting with nausea, abdominal pain and diaphoresis, and found to have SBO with transition point in RLQ.  Started on SBO protocol by general surgery.   Subjective: Seen and examined earlier this morning.  No major events overnight or this morning.  Pain and nausea improved.  No emesis.  Reports passing gas and a very small stool.  Denies chest pain or dyspnea.  Objective: Vitals:   04/29/20 2123 04/30/20 0120 04/30/20 0525 04/30/20 1041  BP: (!) 164/80 (!) 150/68 (!) 154/67 (!) 144/66  Pulse: 88 80 78 68  Resp: 17 17 16 18   Temp: 98.6 F (37 C) 98.7 F (37.1 C) 98.7 F (37.1 C) 98 F (36.7 C)  TempSrc: Oral Oral Oral Oral  SpO2: 93% 90% 92% 98%  Weight:      Height:        Intake/Output Summary (Last 24 hours) at 04/30/2020 1321 Last data filed at 04/30/2020 1000 Gross per 24 hour  Intake 1312.5 ml  Output 1025 ml  Net 287.5 ml   Filed Weights   04/29/20 1439  Weight: 93.4 kg    Examination:  GENERAL: No apparent distress.  Nontoxic. HEENT: MMM.  Vision and hearing grossly intact.  NGT in place. NECK: Supple.  No apparent JVD.  RESP:  No IWOB.  Fair aeration bilaterally. CVS:  RRR. Heart sounds normal.  ABD/GI/GU: BS+. Abd soft, NTND.  MSK/EXT:  Moves extremities. No apparent deformity. No edema.  SKIN: no apparent skin lesion or wound NEURO: Awake, alert and oriented appropriately.  No apparent focal neuro deficit. PSYCH: Calm. Normal affect.   Procedures:  NG tube placement for SBO  Microbiology summarized: [id number] and influenza PCR nonreactive.  Assessment & Plan: Small bowel obstruction-recurrent issue.  Reports passing gas this morning. -General surgery managing -SBO protocol with NG  tube, n.p.o., IV fluids, antiemetics and analgesics  Controlled NIDDM-2 with hyperlipidemia: A1c 6.1%. Recent Labs  Lab 04/29/20 2257 04/30/20 1156  GLUCAP 133* 94  -Continue SSI -Statin on hold while n.p.o.  Essential hypertension: Normotensive. -IV hydralazine as needed while n.p.o.  GERD without esophagitis -Continue IV Protonix  Leukocytosis/bandemia: Likely demargination from stress and hemoconcentration from dehydration. -Continue monitoring  Class I obesity Body mass index is 33.25 kg/m.  -Encourage lifestyle change to lose weight       DVT prophylaxis:  enoxaparin (LOVENOX) injection 40 mg Start: 04/30/20 1000  Code Status: full code Family Communication: Updated patient and husband at bedside. Level of care: Med-Surg Status is: Inpatient  Remains inpatient appropriate because:Ongoing diagnostic testing needed not appropriate for outpatient work up, IV treatments appropriate due to intensity of illness or inability to take PO and Inpatient level of care appropriate due to severity of illness   Dispo: The patient is from: Home              Anticipated d/c is to: Home              Patient currently is not medically stable to d/c.   Difficult to place patient No       Consultants:  General surgery   Sch Meds:  Scheduled Meds: . enoxaparin (LOVENOX) injection  40 mg Subcutaneous Q24H  . insulin aspart  0-15 Units Subcutaneous Q6H  .  pantoprazole (PROTONIX) IV  40 mg Intravenous Q24H   Continuous Infusions: . lactated ringers 100 mL/hr at 04/29/20 2250   PRN Meds:.acetaminophen **OR** acetaminophen, hydrALAZINE, morphine injection **OR** morphine injection, ondansetron (ZOFRAN) IV, phenol  Antimicrobials: Anti-infectives (From admission, onward)   Start     Dose/Rate Route Frequency Ordered Stop   04/30/20 0000  piperacillin-tazobactam (ZOSYN) IVPB 3.375 g  Status:  Discontinued       "Followed by" Linked Group Details   3.375 g 12.5 mL/hr over  240 Minutes Intravenous Every 8 hours 04/29/20 1740 04/30/20 0118   04/29/20 1745  piperacillin-tazobactam (ZOSYN) IVPB 3.375 g       "Followed by" Linked Group Details   3.375 g 100 mL/hr over 30 Minutes Intravenous  Once 04/29/20 1740 04/29/20 1900       I have personally reviewed the following labs and images: CBC: Recent Labs  Lab 04/29/20 1659 04/30/20 0426  WBC 19.1* 12.7*  NEUTROABS 17.1* 9.4*  HGB 16.5* 13.5  HCT 48.2* 41.2  MCV 85.5 87.8  PLT 277 194   BMP &GFR Recent Labs  Lab 04/29/20 1659 04/30/20 0426  NA 140 143  K 4.1 4.2  CL 103 110  CO2 24 20*  GLUCOSE 173* 73  BUN 13 14  CREATININE 0.80 0.83  CALCIUM 9.9 8.9  MG  --  2.0   Estimated Creatinine Clearance: 69.5 mL/min (by C-G formula based on SCr of 0.83 mg/dL). Liver & Pancreas: Recent Labs  Lab 04/29/20 1659 04/30/20 0426  AST 35 27  ALT 26 19  ALKPHOS 95 75  BILITOT 0.5 0.7  PROT 7.7 6.3*  ALBUMIN 4.4 3.6   Recent Labs  Lab 04/29/20 1659  LIPASE 54*   No results for input(s): AMMONIA in the last 168 hours. Diabetic: Recent Labs    04/30/20 0426  HGBA1C 6.1*   Recent Labs  Lab 04/29/20 2257 04/30/20 1156  GLUCAP 133* 94   Cardiac Enzymes: No results for input(s): CKTOTAL, CKMB, CKMBINDEX, TROPONINI in the last 168 hours. No results for input(s): PROBNP in the last 8760 hours. Coagulation Profile: No results for input(s): INR, PROTIME in the last 168 hours. Thyroid Function Tests: No results for input(s): TSH, T4TOTAL, FREET4, T3FREE, THYROIDAB in the last 72 hours. Lipid Profile: No results for input(s): CHOL, HDL, LDLCALC, TRIG, CHOLHDL, LDLDIRECT in the last 72 hours. Anemia Panel: No results for input(s): VITAMINB12, FOLATE, FERRITIN, TIBC, IRON, RETICCTPCT in the last 72 hours. Urine analysis:    Component Value Date/Time   COLORURINE YELLOW 04/29/2020 1608   APPEARANCEUR CLOUDY (A) 04/29/2020 1608   LABSPEC >1.030 (H) 04/29/2020 1608   PHURINE 5.5 04/29/2020  1608   GLUCOSEU NEGATIVE 04/29/2020 1608   HGBUR NEGATIVE 04/29/2020 1608   BILIRUBINUR NEGATIVE 04/29/2020 1608   KETONESUR 15 (A) 04/29/2020 1608   PROTEINUR 100 (A) 04/29/2020 1608   UROBILINOGEN 0.2 09/09/2013 2142   NITRITE NEGATIVE 04/29/2020 1608   LEUKOCYTESUR TRACE (A) 04/29/2020 1608   Sepsis Labs: Invalid input(s): PROCALCITONIN, [place]  Microbiology: Recent Results (from the past 240 hour(s))  Resp Panel by RT-PCR (Flu A&B, Covid) Nasopharyngeal Swab     Status: None   Collection Time: 04/29/20  7:24 PM   Specimen: Nasopharyngeal Swab; Nasopharyngeal(NP) swabs in vial transport medium  Result Value Ref Range Status   SARS Coronavirus 2 by RT PCR NEGATIVE NEGATIVE Final    Comment: (NOTE) SARS-CoV-2 target nucleic acids are NOT DETECTED.  The SARS-CoV-2 RNA is generally detectable in upper respiratory specimens  during the acute phase of infection. The lowest concentration of SARS-CoV-2 viral copies this assay can detect is 138 copies/mL. A negative result does not preclude SARS-Cov-2 infection and should not be used as the sole basis for treatment or other patient management decisions. A negative result may occur with  improper specimen collection/handling, submission of specimen other than nasopharyngeal swab, presence of viral mutation(s) within the areas targeted by this assay, and inadequate number of viral copies(<138 copies/mL). A negative result must be combined with clinical observations, patient history, and epidemiological information. The expected result is Negative.  Fact Sheet for Patients:  EntrepreneurPulse.com.au  Fact Sheet for Healthcare Providers:  IncredibleEmployment.be  This test is no t yet approved or cleared by the [place] FDA and  has been authorized for detection and/or diagnosis of SARS-CoV-2 by FDA under an Emergency Use Authorization (EUA). This EUA will remain  in effect (meaning this  test can be used) for the duration of the COVID-19 declaration under Section 564(b)(1) of the Act, 21 U.S.C.section 360bbb-3(b)(1), unless the authorization is terminated  or revoked sooner.       Influenza A by PCR NEGATIVE NEGATIVE Final   Influenza B by PCR NEGATIVE NEGATIVE Final    Comment: (NOTE) The Xpert Xpress SARS-CoV-2/FLU/RSV plus assay is intended as an aid in the diagnosis of influenza from Nasopharyngeal swab specimens and should not be used as a sole basis for treatment. Nasal washings and aspirates are unacceptable for Xpert Xpress SARS-CoV-2/FLU/RSV testing.  Fact Sheet for Patients: EntrepreneurPulse.com.au  Fact Sheet for Healthcare Providers: IncredibleEmployment.be  This test is not yet approved or cleared by the [place] FDA and has been authorized for detection and/or diagnosis of SARS-CoV-2 by FDA under an Emergency Use Authorization (EUA). This EUA will remain in effect (meaning this test can be used) for the duration of the COVID-19 declaration under Section 564(b)(1) of the Act, 21 U.S.C. section 360bbb-3(b)(1), unless the authorization is terminated or revoked.  Performed at [facility], [address], [place], [place] [address]     Radiology Studies: CT Abdomen Pelvis W Contrast  Result Date: 04/29/2020 CLINICAL DATA:  Abdominal pain.  Nausea and vomiting. EXAM: CT ABDOMEN AND PELVIS WITH CONTRAST TECHNIQUE: Multidetector CT imaging of the abdomen and pelvis was performed using the standard protocol following bolus administration of intravenous contrast. CONTRAST:  141mL OMNIPAQUE IOHEXOL 300 MG/ML  SOLN COMPARISON:  CT scan 02/16/2020 FINDINGS: Lower chest: Minimal dependent bibasilar atelectasis. No infiltrates or effusions. No pulmonary lesions. The heart is normal in size. No pericardial effusion. Hepatobiliary: Stable low-attenuation left hepatic lobe lesion consistent with benign cysts. No  worrisome hepatic lesions or intrahepatic biliary dilatation. A small gallstone is noted in the gallbladder. No findings for acute cholecystitis. No common bile duct dilatation. Pancreas: No mass, inflammation or ductal dilatation. Spleen: Normal size.  No focal lesions. Adrenals/Urinary Tract: Adrenal glands are normal. Upper pole left renal calculus but no obstructing ureteral calculi or bladder calculi. No worrisome renal or bladder lesions. Stomach/Bowel: The stomach is moderately distended and fluid-filled. The small bowel is also distended with fluid along with scattered air-fluid levels consistent with obstruction. There is a transition to nondilated/decompressed distal ileal loops of small bowel in the right lower quadrant likely due to adhesions. The colon is diffusely decompressed. There is scattered diffuse colonic diverticulosis. Vascular/Lymphatic: Moderate atherosclerotic calcifications involving the aorta and branch vessels. No aneurysm or dissection. The major venous structures are patent. No mesenteric or retroperitoneal mass or adenopathy.  Reproductive: Surgically absent. Other: No pelvic mass or adenopathy. No free pelvic fluid collections. No inguinal mass or adenopathy. No abdominal wall hernia or subcutaneous lesions. Musculoskeletal: No significant bony findings. Age related degenerative changes involving the spine. 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 CT findings consistent with a small bowel obstruction with the transition point in the right lower quadrant. This is likely due to adhesions. No mass or other cause is identified. 2. Cholelithiasis. 3. Upper pole left renal calculus but no obstructing ureteral calculi or bladder calculi. 4. Advanced atherosclerotic calcifications involving the aorta and branch vessels. 5. 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 Aortic Atherosclerosis  (ICD10-I70.0). Electronically Signed   By: Marijo Sanes M.D.   On: 04/29/2020 18:33   DG Abd 2 Views  Result Date: 04/30/2020 CLINICAL DATA:  Follow-up small bowel obstruction. EXAM: PORTABLE ABDOMEN-2 VIEW COMPARISON:  Portable abdomen x-ray 04/29/2020 and earlier. CT abdomen and pelvis 04/29/2020. FINDINGS: No significant change in the gaseous distension of multiple loops of small bowel in the mid abdomen and LEFT UPPER QUADRANT. Gas and stool throughout normal caliber colon. No evidence of free air on the [employer]. Nasogastric tube tip in the distal body of the stomach. IMPRESSION: Stable partial small bowel obstruction. No free intraperitoneal air. Nasogastric tube tip in the distal body of the stomach. Electronically Signed   By: Evangeline Dakin M.D.   On: 04/30/2020 09:37   DG Abd Portable 1V  Result Date: 04/29/2020 CLINICAL DATA:  NG tube insertion EXAM: PORTABLE ABDOMEN - 1 VIEW COMPARISON:  CT 04/29/2020, radiograph 02/17/2020 FINDINGS: Esophageal tube tip and side port overlie the proximal to mid stomach. Excreted contrast within the renal collecting systems. IMPRESSION: Esophageal tube tip overlies the mid stomach Electronically Signed   By: Donavan Foil M.D.   On: 04/29/2020 20:22      Dayanna Pryce T. [place]  If 7PM-7AM, please contact night-coverage [email or web address] 04/30/2020, 1:21 PM

## 2020-05-01 DIAGNOSIS — K56609 Unspecified intestinal obstruction, unspecified as to partial versus complete obstruction: Secondary | ICD-10-CM | POA: Diagnosis not present

## 2020-05-01 DIAGNOSIS — K219 Gastro-esophageal reflux disease without esophagitis: Secondary | ICD-10-CM | POA: Diagnosis not present

## 2020-05-01 DIAGNOSIS — I10 Essential (primary) hypertension: Secondary | ICD-10-CM | POA: Diagnosis not present

## 2020-05-01 DIAGNOSIS — E1169 Type 2 diabetes mellitus with other specified complication: Secondary | ICD-10-CM | POA: Diagnosis not present

## 2020-05-01 LAB — LIPASE, BLOOD: Lipase: 53 U/L — ABNORMAL HIGH (ref 11–51)

## 2020-05-01 LAB — COMPREHENSIVE METABOLIC PANEL
ALT: 16 U/L (ref 0–44)
AST: 25 U/L (ref 15–41)
Albumin: 3.5 g/dL (ref 3.5–5.0)
Alkaline Phosphatase: 73 U/L (ref 38–126)
Anion gap: 11 (ref 5–15)
BUN: 9 mg/dL (ref 8–23)
CO2: 24 mmol/L (ref 22–32)
Calcium: 8.8 mg/dL — ABNORMAL LOW (ref 8.9–10.3)
Chloride: 104 mmol/L (ref 98–111)
Creatinine, Ser: 0.62 mg/dL (ref 0.44–1.00)
GFR, Estimated: >60 mL/min (ref >60)
Glucose, Bld: 95 mg/dL (ref 70–99)
Potassium: 3.6 mmol/L (ref 3.5–5.1)
Sodium: 139 mmol/L (ref 135–145)
Total Bilirubin: 1 mg/dL (ref 0.3–1.2)
Total Protein: 6.2 g/dL — ABNORMAL LOW (ref 6.5–8.1)

## 2020-05-01 LAB — MAGNESIUM: Magnesium: 1.8 mg/dL (ref 1.7–2.4)

## 2020-05-01 LAB — CBC
HCT: 40.9 % (ref 36.0–46.0)
Hemoglobin: 13.6 g/dL (ref 12.0–15.0)
MCH: 29.1 pg (ref 26.0–34.0)
MCHC: 33.3 g/dL (ref 30.0–36.0)
MCV: 87.4 fL (ref 80.0–100.0)
Platelets: 232 10*3/uL (ref 150–400)
RBC: 4.68 MIL/uL (ref 3.87–5.11)
RDW: 14.2 % (ref 11.5–15.5)
WBC: 8.8 10*3/uL (ref 4.0–10.5)
nRBC: 0 % (ref 0.0–0.2)

## 2020-05-01 LAB — GLUCOSE, CAPILLARY: Glucose-Capillary: 99 mg/dL (ref 70–99)

## 2020-05-01 LAB — PHOSPHORUS: Phosphorus: 2.7 mg/dL (ref 2.5–4.6)

## 2020-05-01 MED ORDER — POTASSIUM CHLORIDE CRYS ER 20 MEQ PO TBCR
40.0000 meq | EXTENDED_RELEASE_TABLET | Freq: Once | ORAL | Status: AC
  Administered 2020-05-01: 40 meq via ORAL
  Filled 2020-05-01: qty 2

## 2020-05-01 NOTE — Progress Notes (Signed)
Patient alert and oriented, tolerating diet. D/C instructions given, pt d/cd home.

## 2020-05-01 NOTE — Progress Notes (Signed)
Subjective/Chief Complaint: Having flatus and bms, tol diet   Objective: Vital signs in last 24 hours: Temp:  [98 F (36.7 C)-98.6 F (37 C)] 98.2 F (36.8 C) (04/10 0517) Pulse Rate:  [68-108] 75 (04/10 0517) Resp:  [16-19] 16 (04/10 0517) BP: (138-163)/(66-72) 142/66 (04/10 0517) SpO2:  [89 %-98 %] 93 % (04/10 0517) Last BM Date: 05/01/20  Intake/Output from previous day: 04/09 0701 - 04/10 0700 In: 1623.9 [P.O.:240; I.V.:1383.9] Out: 250 [Urine:200; Emesis/NG output:50] Intake/Output this shift: No intake/output data recorded.  Ab soft, nontender  Lab Results:  Recent Labs    04/30/20 0426 05/01/20 0709  WBC 12.7* 8.8  HGB 13.5 13.6  HCT 41.2 40.9  PLT 194 232   BMET Recent Labs    04/30/20 0426 05/01/20 0503  NA 143 139  K 4.2 3.6  CL 110 104  CO2 20* 24  GLUCOSE 73 95  BUN 14 9  CREATININE 0.83 0.62  CALCIUM 8.9 8.8*   PT/INR No results for input(s): LABPROT, INR in the last 72 hours. ABG No results for input(s): PHART, HCO3 in the last 72 hours.  Invalid input(s): PCO2, PO2  Studies/Results: CT Abdomen Pelvis W Contrast  Result Date: 04/29/2020 CLINICAL DATA:  Abdominal pain.  Nausea and vomiting. EXAM: CT ABDOMEN AND PELVIS WITH CONTRAST TECHNIQUE: Multidetector CT imaging of the abdomen and pelvis was performed using the standard protocol following bolus administration of intravenous contrast. CONTRAST:  168mL OMNIPAQUE IOHEXOL 300 MG/ML  SOLN COMPARISON:  CT scan 02/16/2020 FINDINGS: Lower chest: Minimal dependent bibasilar atelectasis. No infiltrates or effusions. No pulmonary lesions. The heart is normal in size. No pericardial effusion. Hepatobiliary: Stable low-attenuation left hepatic lobe lesion consistent with benign cysts. No worrisome hepatic lesions or intrahepatic biliary dilatation. A small gallstone is noted in the gallbladder. No findings for acute cholecystitis. No common bile duct dilatation. Pancreas: No mass, inflammation or  ductal dilatation. Spleen: Normal size.  No focal lesions. Adrenals/Urinary Tract: Adrenal glands are normal. Upper pole left renal calculus but no obstructing ureteral calculi or bladder calculi. No worrisome renal or bladder lesions. Stomach/Bowel: The stomach is moderately distended and fluid-filled. The small bowel is also distended with fluid along with scattered air-fluid levels consistent with obstruction. There is a transition to nondilated/decompressed distal ileal loops of small bowel in the right lower quadrant likely due to adhesions. The colon is diffusely decompressed. There is scattered diffuse colonic diverticulosis. Vascular/Lymphatic: Moderate atherosclerotic calcifications involving the aorta and branch vessels. No aneurysm or dissection. The major venous structures are patent. No mesenteric or retroperitoneal mass or adenopathy. Reproductive: Surgically absent. Other: No pelvic mass or adenopathy. No free pelvic fluid collections. No inguinal mass or adenopathy. No abdominal wall hernia or subcutaneous lesions. Musculoskeletal: No significant bony findings. Age related degenerative changes involving the spine. 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 CT findings consistent with a small bowel obstruction with the transition point in the right lower quadrant. This is likely due to adhesions. No mass or other cause is identified. 2. Cholelithiasis. 3. Upper pole left renal calculus but no obstructing ureteral calculi or bladder calculi. 4. Advanced atherosclerotic calcifications involving the aorta and branch vessels. 5. 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 Aortic Atherosclerosis (ICD10-I70.0). Electronically Signed   By: Marijo Sanes M.D.   On: 04/29/2020 18:33   DG Abd 2 Views  Result Date: 04/30/2020 CLINICAL DATA:  Follow-up small bowel obstruction.  EXAM: PORTABLE ABDOMEN-2 VIEW  COMPARISON:  Portable abdomen x-ray 04/29/2020 and earlier. CT abdomen and pelvis 04/29/2020. FINDINGS: No significant change in the gaseous distension of multiple loops of small bowel in the mid abdomen and LEFT UPPER QUADRANT. Gas and stool throughout normal caliber colon. No evidence of free air on the [employer]. Nasogastric tube tip in the distal body of the stomach. IMPRESSION: Stable partial small bowel obstruction. No free intraperitoneal air. Nasogastric tube tip in the distal body of the stomach. Electronically Signed   By: Evangeline Dakin M.D.   On: 04/30/2020 09:37   DG Abd Portable 1V-Small Bowel Obstruction Protocol-initial, 8 hr delay  Result Date: 04/30/2020 CLINICAL DATA:  8 hour delay bowel obstruction EXAM: PORTABLE ABDOMEN - 1 VIEW COMPARISON:  04/30/2020, 04/29/2020 FINDINGS: Esophageal tube tip projects over the distal stomach. Air filled slightly distended small bowel in the left abdomen with loops measuring up to 3.5 cm, overall decreased number of air-filled distended small bowel loops compared to prior. Enteral contrast is present within the colon and rectum IMPRESSION: Enteral contrast is present within the colon and rectum. Decreased number of air-filled distended small bowel loops compared to prior. Electronically Signed   By: Donavan Foil M.D.   On: 04/30/2020 19:21   DG Abd Portable 1V  Result Date: 04/29/2020 CLINICAL DATA:  NG tube insertion EXAM: PORTABLE ABDOMEN - 1 VIEW COMPARISON:  CT 04/29/2020, radiograph 02/17/2020 FINDINGS: Esophageal tube tip and side port overlie the proximal to mid stomach. Excreted contrast within the renal collecting systems. IMPRESSION: Esophageal tube tip overlies the mid stomach Electronically Signed   By: Donavan Foil M.D.   On: 04/29/2020 20:22    Anti-infectives: Anti-infectives (From admission, onward)   Start     Dose/Rate Route Frequency Ordered Stop   04/30/20 0000  piperacillin-tazobactam (ZOSYN) IVPB 3.375 g  Status:   Discontinued       "Followed by" Linked Group Details   3.375 g 12.5 mL/hr over 240 Minutes Intravenous Every 8 hours 04/29/20 1740 04/30/20 0118   04/29/20 1745  piperacillin-tazobactam (ZOSYN) IVPB 3.375 g       "Followed by" Linked Group Details   3.375 g 100 mL/hr over 30 Minutes Intravenous  Once 04/29/20 1740 04/29/20 1900      Assessment/Plan: SBO -resolved clinically and radiographically quickly -this is second obstruction in short period but this has resolved so quickly I dont think I will recommend laparoscopy.  If she has another recurrence quickly I think she will need this but will give her chance for now. -dc home  Rolm Bookbinder 05/01/2020

## 2020-05-01 NOTE — Discharge Summary (Signed)
Physician Discharge Summary  Nora Panuco PIR:[id number] DOB: 05/06/1945 DOA: 04/29/2020  PCP: Tempie Hoist, MD  Admit date: 04/29/2020 Discharge date: 05/01/2020  Admitted From: Home Disposition: Home  Recommendations for Outpatient Follow-up:  1. Follow ups as below. 2. Please obtain CBC/BMP/Mag at follow up 3. Please follow up on the following pending results: None  Home Health: None Equipment/Devices: None  Discharge Condition: Stable CODE STATUS: Full code    Hospital Course: 75 year old F with PMH of SBO, HTN, HLD, GERD and DM-2 presenting with nausea, abdominal pain and diaphoresis, and found to have SBO with transition point in RLQ.  Started on SBO protocol with NG tube decompression by general surgery.   The next day, KUB 8 hours after oral contrast showed contrast in the colon arguing against ongoing SBO.    On the day of discharge, she was a started on clear liquid diet and advance to full liquid diet that she tolerated.  She was cleared for discharge by general surgery  See individual problem list below for more on hospital course.  Discharge Diagnoses:  Small bowel obstruction-recurrent issue.  Resolved. -Tolerating full liquid diet and cleared for discharge by general surgery. -Patient to advance her diet at home  Controlled NIDDM-2 with hyperlipidemia: A1c 6.1%. -Continue home medications  Essential hypertension: Normotensive. -Discharged on home medications  GERD without esophagitis -Continue home meds  Leukocytosis/bandemia: Likely demargination and hemoconcentration.  Resolved.  Class I obesity Body mass index is 33.25 kg/m.  -Encourage lifestyle change to lose weight.          Discharge Exam: Vitals:   04/30/20 2112 05/01/20 0517  BP: 138/72 (!) 142/66  Pulse: (!) 108 75  Resp: 19 16  Temp: 98.6 F (37 C) 98.2 F (36.8 C)  SpO2: (!) 89% 93%    GENERAL: No apparent distress.  Nontoxic. HEENT: MMM.  Vision and hearing  grossly intact.  NECK: Supple.  No apparent JVD.  RESP: On RA.  No IWOB.  Fair aeration bilaterally. CVS:  RRR. Heart sounds normal.  ABD/GI/GU: Bowel sounds present. Soft. Non tender.  MSK/EXT:  Moves extremities. No apparent deformity. No edema.  SKIN: no apparent skin lesion or wound NEURO: Awake, alert and oriented appropriately.  No apparent focal neuro deficit. PSYCH: Calm. Normal affect.   Discharge Instructions  Discharge Instructions    Call MD for:  persistant nausea and vomiting   Complete by: As directed    Call MD for:  severe uncontrolled pain   Complete by: As directed    Call MD for:  temperature >100.4   Complete by: As directed    Diet - low sodium heart healthy   Complete by: As directed    Continue with full liquid diet over the next 1 to 2 days and advance to soft diet   Diet Carb Modified   Complete by: As directed    Discharge instructions   Complete by: As directed    It has been a pleasure taking care of you!  You were hospitalized with a small bowel obstruction seems to have resolved.  You may continue full liquid diet for the next 1 to 2 days and advance to soft diet.    Take care,   Increase activity slowly   Complete by: As directed      Allergies as of 05/01/2020      Reactions   Lipitor [atorvastatin] Other (See Comments)   Muscle cramps   Metformin And Related Other (See Comments)   Leg  pain      Medication List    TAKE these medications   amLODipine 2.5 MG tablet Commonly known as: NORVASC Take 2.5 mg by mouth daily.   anastrozole 1 MG tablet Commonly known as: ARIMIDEX Take 1 mg by mouth daily.   B-complex with vitamin C tablet Take 1 tablet by mouth daily.   Biotin 10 MG Tabs Take 10 mg by mouth every morning.   gabapentin 300 MG capsule Commonly known as: NEURONTIN Take 300 mg by mouth 2 (two) times daily.   glipiZIDE XL 5 MG 24 hr tablet Generic drug: glipiZIDE Take 5 mg by mouth daily with breakfast.    multivitamin with minerals Tabs tablet Take 1 tablet by mouth daily.   omeprazole 20 MG capsule Commonly known as: PRILOSEC Take 20 mg by mouth daily as needed (heartburn).   rosuvastatin 10 MG tablet Commonly known as: CRESTOR Take 10 mg by mouth every morning.   saccharomyces boulardii 250 MG capsule Commonly known as: Florastor Take 1 capsule (250 mg total) by mouth 2 (two) times daily.       Consultations:  General surgery  Procedures/Studies:  NG tube decompression for small bowel obstruction   CT Abdomen Pelvis W Contrast  Result Date: 04/29/2020 CLINICAL DATA:  Abdominal pain.  Nausea and vomiting. EXAM: CT ABDOMEN AND PELVIS WITH CONTRAST TECHNIQUE: Multidetector CT imaging of the abdomen and pelvis was performed using the standard protocol following bolus administration of intravenous contrast. CONTRAST:  136mL OMNIPAQUE IOHEXOL 300 MG/ML  SOLN COMPARISON:  CT scan 02/16/2020 FINDINGS: Lower chest: Minimal dependent bibasilar atelectasis. No infiltrates or effusions. No pulmonary lesions. The heart is normal in size. No pericardial effusion. Hepatobiliary: Stable low-attenuation left hepatic lobe lesion consistent with benign cysts. No worrisome hepatic lesions or intrahepatic biliary dilatation. A small gallstone is noted in the gallbladder. No findings for acute cholecystitis. No common bile duct dilatation. Pancreas: No mass, inflammation or ductal dilatation. Spleen: Normal size.  No focal lesions. Adrenals/Urinary Tract: Adrenal glands are normal. Upper pole left renal calculus but no obstructing ureteral calculi or bladder calculi. No worrisome renal or bladder lesions. Stomach/Bowel: The stomach is moderately distended and fluid-filled. The small bowel is also distended with fluid along with scattered air-fluid levels consistent with obstruction. There is a transition to nondilated/decompressed distal ileal loops of small bowel in the right lower quadrant likely due to  adhesions. The colon is diffusely decompressed. There is scattered diffuse colonic diverticulosis. Vascular/Lymphatic: Moderate atherosclerotic calcifications involving the aorta and branch vessels. No aneurysm or dissection. The major venous structures are patent. No mesenteric or retroperitoneal mass or adenopathy. Reproductive: Surgically absent. Other: No pelvic mass or adenopathy. No free pelvic fluid collections. No inguinal mass or adenopathy. No abdominal wall hernia or subcutaneous lesions. Musculoskeletal: No significant bony findings. Age related degenerative changes involving the spine. 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 CT findings consistent with a small bowel obstruction with the transition point in the right lower quadrant. This is likely due to adhesions. No mass or other cause is identified. 2. Cholelithiasis. 3. Upper pole left renal calculus but no obstructing ureteral calculi or bladder calculi. 4. Advanced atherosclerotic calcifications involving the aorta and branch vessels. 5. 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 Aortic Atherosclerosis (ICD10-I70.0). Electronically Signed   By: Marijo Sanes M.D.   On: 04/29/2020 18:33   DG Abd 2 Views  Result Date: 04/30/2020 CLINICAL  DATA:  Follow-up small bowel obstruction. EXAM: PORTABLE ABDOMEN-2 VIEW COMPARISON:  Portable abdomen x-ray 04/29/2020 and earlier. CT abdomen and pelvis 04/29/2020. FINDINGS: No significant change in the gaseous distension of multiple loops of small bowel in the mid abdomen and LEFT UPPER QUADRANT. Gas and stool throughout normal caliber colon. No evidence of free air on the [employer]. Nasogastric tube tip in the distal body of the stomach. IMPRESSION: Stable partial small bowel obstruction. No free intraperitoneal air. Nasogastric tube tip in the distal body of the stomach. Electronically Signed   By: Evangeline Dakin M.D.   On: 04/30/2020 09:37   DG Abd Portable 1V-Small Bowel Obstruction Protocol-initial, 8 hr delay  Result Date: 04/30/2020 CLINICAL DATA:  8 hour delay bowel obstruction EXAM: PORTABLE ABDOMEN - 1 VIEW COMPARISON:  04/30/2020, 04/29/2020 FINDINGS: Esophageal tube tip projects over the distal stomach. Air filled slightly distended small bowel in the left abdomen with loops measuring up to 3.5 cm, overall decreased number of air-filled distended small bowel loops compared to prior. Enteral contrast is present within the colon and rectum IMPRESSION: Enteral contrast is present within the colon and rectum. Decreased number of air-filled distended small bowel loops compared to prior. Electronically Signed   By: Donavan Foil M.D.   On: 04/30/2020 19:21   DG Abd Portable 1V  Result Date: 04/29/2020 CLINICAL DATA:  NG tube insertion EXAM: PORTABLE ABDOMEN - 1 VIEW COMPARISON:  CT 04/29/2020, radiograph 02/17/2020 FINDINGS: Esophageal tube tip and side port overlie the proximal to mid stomach. Excreted contrast within the renal collecting systems. IMPRESSION: Esophageal tube tip overlies the mid stomach Electronically Signed   By: Donavan Foil M.D.   On: 04/29/2020 20:22        The results of significant diagnostics from this hospitalization (including imaging, microbiology, ancillary and laboratory) are listed below for reference.     Microbiology: Recent Results (from the past 240 hour(s))  Resp Panel by RT-PCR (Flu A&B, Covid) Nasopharyngeal Swab     Status: None   Collection Time: 04/29/20  7:24 PM   Specimen: Nasopharyngeal Swab; Nasopharyngeal(NP) swabs in vial transport medium  Result Value Ref Range Status   SARS Coronavirus 2 by RT PCR NEGATIVE NEGATIVE Final    Comment: (NOTE) SARS-CoV-2 target nucleic acids are NOT DETECTED.  The SARS-CoV-2 RNA is generally detectable in upper respiratory specimens during the acute phase of infection. The lowest concentration of  SARS-CoV-2 viral copies this assay can detect is 138 copies/mL. A negative result does not preclude SARS-Cov-2 infection and should not be used as the sole basis for treatment or other patient management decisions. A negative result may occur with  improper specimen collection/handling, submission of specimen other than nasopharyngeal swab, presence of viral mutation(s) within the areas targeted by this assay, and inadequate number of viral copies(<138 copies/mL). A negative result must be combined with clinical observations, patient history, and epidemiological information. The expected result is Negative.  Fact Sheet for Patients:  EntrepreneurPulse.com.au  Fact Sheet for Healthcare Providers:  IncredibleEmployment.be  This test is no t yet approved or cleared by the [place] FDA and  has been authorized for detection and/or diagnosis of SARS-CoV-2 by FDA under an Emergency Use Authorization (EUA). This EUA will remain  in effect (meaning this test can be used) for the duration of the COVID-19 declaration under Section 564(b)(1) of the Act, 21 U.S.C.section 360bbb-3(b)(1), unless the authorization is terminated  or revoked sooner.       Influenza A by  PCR NEGATIVE NEGATIVE Final   Influenza B by PCR NEGATIVE NEGATIVE Final    Comment: (NOTE) The Xpert Xpress SARS-CoV-2/FLU/RSV plus assay is intended as an aid in the diagnosis of influenza from Nasopharyngeal swab specimens and should not be used as a sole basis for treatment. Nasal washings and aspirates are unacceptable for Xpert Xpress SARS-CoV-2/FLU/RSV testing.  Fact Sheet for Patients: EntrepreneurPulse.com.au  Fact Sheet for Healthcare Providers: IncredibleEmployment.be  This test is not yet approved or cleared by the [place] FDA and has been authorized for detection and/or diagnosis of SARS-CoV-2 by FDA under an Emergency Use  Authorization (EUA). This EUA will remain in effect (meaning this test can be used) for the duration of the COVID-19 declaration under Section 564(b)(1) of the Act, 21 U.S.C. section 360bbb-3(b)(1), unless the authorization is terminated or revoked.  Performed at [facility], [address] [place], [place] [address]      Labs:  CBC: Recent Labs  Lab 04/29/20 1659 04/30/20 0426 05/01/20 0709  WBC 19.1* 12.7* 8.8  NEUTROABS 17.1* 9.4*  --   HGB 16.5* 13.5 13.6  HCT 48.2* 41.2 40.9  MCV 85.5 87.8 87.4  PLT 277 194 232   BMP &GFR Recent Labs  Lab 04/29/20 1659 04/30/20 0426 05/01/20 0503  NA 140 143 139  K 4.1 4.2 3.6  CL 103 110 104  CO2 24 20* 24  GLUCOSE 173* 73 95  BUN 13 14 9   CREATININE 0.80 0.83 0.62  CALCIUM 9.9 8.9 8.8*  MG  --  2.0 1.8  PHOS  --   --  2.7   Estimated Creatinine Clearance: 72.1 mL/min (by C-G formula based on SCr of 0.62 mg/dL). Liver & Pancreas: Recent Labs  Lab 04/29/20 1659 04/30/20 0426 05/01/20 0503  AST 35 27 25  ALT 26 19 16   ALKPHOS 95 75 73  BILITOT 0.5 0.7 1.0  PROT 7.7 6.3* 6.2*  ALBUMIN 4.4 3.6 3.5   Recent Labs  Lab 04/29/20 1659 05/01/20 0503  LIPASE 54* 53*   No results for input(s): AMMONIA in the last 168 hours. Diabetic: Recent Labs    04/30/20 0426  HGBA1C 6.1*   Recent Labs  Lab 04/29/20 2257 04/30/20 1156 04/30/20 1805 04/30/20 2355 05/01/20 0519  GLUCAP 133* 94 115* 91 99   Cardiac Enzymes: No results for input(s): CKTOTAL, CKMB, CKMBINDEX, TROPONINI in the last 168 hours. No results for input(s): PROBNP in the last 8760 hours. Coagulation Profile: No results for input(s): INR, PROTIME in the last 168 hours. Thyroid Function Tests: No results for input(s): TSH, T4TOTAL, FREET4, T3FREE, THYROIDAB in the last 72 hours. Lipid Profile: No results for input(s): CHOL, HDL, LDLCALC, TRIG, CHOLHDL, LDLDIRECT in the last 72 hours. Anemia Panel: No results for input(s): VITAMINB12,  FOLATE, FERRITIN, TIBC, IRON, RETICCTPCT in the last 72 hours. Urine analysis:    Component Value Date/Time   COLORURINE YELLOW 04/29/2020 1608   APPEARANCEUR CLOUDY (A) 04/29/2020 1608   LABSPEC >1.030 (H) 04/29/2020 1608   PHURINE 5.5 04/29/2020 1608   GLUCOSEU NEGATIVE 04/29/2020 1608   HGBUR NEGATIVE 04/29/2020 1608   BILIRUBINUR NEGATIVE 04/29/2020 1608   KETONESUR 15 (A) 04/29/2020 1608   PROTEINUR 100 (A) 04/29/2020 1608   UROBILINOGEN 0.2 09/09/2013 2142   NITRITE NEGATIVE 04/29/2020 1608   LEUKOCYTESUR TRACE (A) 04/29/2020 1608   Sepsis Labs: Invalid input(s): PROCALCITONIN, LACTICIDVEN   Time coordinating discharge: 35 minutes  SIGNED:  Mercy Riding, MD  [facility] 05/01/2020, 2:23  PM  If 7PM-7AM, please contact night-coverage [email or web address]

## 2022-04-05 ENCOUNTER — Other Ambulatory Visit: Payer: Self-pay

## 2022-04-05 ENCOUNTER — Emergency Department (HOSPITAL_BASED_OUTPATIENT_CLINIC_OR_DEPARTMENT_OTHER): Payer: Medicare Other

## 2022-04-05 ENCOUNTER — Emergency Department (HOSPITAL_BASED_OUTPATIENT_CLINIC_OR_DEPARTMENT_OTHER)
Admission: EM | Admit: 2022-04-05 | Discharge: 2022-04-05 | Disposition: A | Discharge status: Home / Self Care | Payer: Medicare Other | Attending: Emergency Medicine | Admitting: Emergency Medicine

## 2022-04-05 ENCOUNTER — Encounter (HOSPITAL_BASED_OUTPATIENT_CLINIC_OR_DEPARTMENT_OTHER): Payer: Self-pay | Admitting: Pediatrics

## 2022-04-05 DIAGNOSIS — X58XXXA Exposure to other specified factors, initial encounter: Secondary | ICD-10-CM | POA: Insufficient documentation

## 2022-04-05 DIAGNOSIS — R2241 Localized swelling, mass and lump, right lower limb: Secondary | ICD-10-CM | POA: Diagnosis not present

## 2022-04-05 DIAGNOSIS — S99911A Unspecified injury of right ankle, initial encounter: Secondary | ICD-10-CM | POA: Diagnosis present

## 2022-04-05 DIAGNOSIS — Z79899 Other long term (current) drug therapy: Secondary | ICD-10-CM | POA: Insufficient documentation

## 2022-04-05 DIAGNOSIS — S93401A Sprain of unspecified ligament of right ankle, initial encounter: Secondary | ICD-10-CM | POA: Insufficient documentation

## 2022-04-05 DIAGNOSIS — I10 Essential (primary) hypertension: Secondary | ICD-10-CM | POA: Diagnosis not present

## 2022-04-05 MED ORDER — NAPROXEN 375 MG PO TABS: 375.0000 mg | ORAL_TABLET | Freq: Two times a day (BID) | ORAL | 0 refills | Status: DC

## 2022-04-05 MED ORDER — NAPROXEN 250 MG PO TABS
375.0000 mg | ORAL_TABLET | Freq: Once | ORAL | Status: AC
  Administered 2022-04-05: 375 mg via ORAL
  Filled 2022-04-05: qty 2

## 2022-04-05 NOTE — ED Notes (Signed)
Discharge paperwork reviewed entirely with patient, including Rx's and follow up care. Pain was under control. Pt verbalized understanding as well as all parties involved. No questions or concerns voiced at the time of discharge. No acute distress noted.

## 2022-04-05 NOTE — ED Triage Notes (Signed)
C/O constant throbbing pain on right ankle x 2 weeks; denies any injury, able to bear weight. Stated salonpas, biofreeze, aleeve, voltarel gel and wraps with no relief.

## 2022-04-05 NOTE — ED Notes (Addendum)
Patient transported to Ultrasound 

## 2022-04-05 NOTE — ED Provider Notes (Signed)
[facility] Provider Note   CSN: [id number] Arrival date & time: 04/05/22  1443     History  Chief Complaint  Patient presents with   Ankle Pain    Cheryl Stevens is a 77 y.o. female.   Ankle Pain    Patient with medical history of hypertension, hyperlipidemia presents to emergency department due to right lower extremity pain.  Started atraumatically 10 days ago, feels it on the lateral malleolus and posterior distal lower right extremity.  It is better when she ambulates, is worse with lying down.  The pain is constant, denies any inciting incident or trauma.  Denies any recent overexertion or change in routine.  No recent travel or surgeries, no chest pain or shortness of breath.  Denies any numbness, he is able to ambulate although that elicits more pain.  Has not had any fevers or chills.  Home Medications Prior to Admission medications   Medication Sig Start Date End Date Taking? Authorizing Provider  naproxen (NAPROSYN) 375 MG tablet Take 1 tablet (375 mg total) by mouth 2 (two) times daily. 04/05/22  Yes Sherrill Raring, PA-C  amLODipine (NORVASC) 2.5 MG tablet Take 2.5 mg by mouth daily.    [provider]  anastrozole (ARIMIDEX) 1 MG tablet Take 1 mg by mouth daily. 01/10/20   [provider]  B Complex-C (B-COMPLEX WITH VITAMIN C) tablet Take 1 tablet by mouth daily.    [provider]  Biotin 10 MG TABS Take 10 mg by mouth every morning.    [provider]  gabapentin (NEURONTIN) 300 MG capsule Take 300 mg by mouth 2 (two) times daily. 04/17/15   [provider]  GLIPIZIDE XL 5 MG 24 hr tablet Take 5 mg by mouth daily with breakfast.  04/17/15   [provider]  Multiple Vitamin (MULTIVITAMIN WITH MINERALS) TABS tablet Take 1 tablet by mouth daily.    [provider]  omeprazole (PRILOSEC) 20 MG capsule Take 20 mg by mouth daily as needed (heartburn). 04/17/15   [provider]  rosuvastatin (CRESTOR) 10 MG tablet Take 10 mg by mouth every morning.     [provider]  saccharomyces boulardii (FLORASTOR) 250 MG capsule Take 1 capsule (250 mg total) by mouth 2 (two) times daily. 04/25/15   Elgergawy, Silver Huguenin, MD      Allergies    Lipitor [atorvastatin] and Metformin and related    Review of Systems   Review of Systems  Physical Exam Updated Vital Signs BP (!) 146/90 (BP Location: Left Arm)   Pulse 71   Temp 98.6 F (37 C)   Resp 17   Ht '5\' 6"'$  (1.676 m)   Wt 93 kg   SpO2 99%   BMI 33.09 kg/m  Physical Exam Vitals and nursing note reviewed.  Constitutional:      General: She is not in acute distress.    Appearance: She is well-developed.  HENT:     Head: Normocephalic and atraumatic.  Eyes:     Conjunctiva/sclera: Conjunctivae normal.  Cardiovascular:     Rate and Rhythm: Normal rate and regular rhythm.     Pulses: Normal pulses.  Pulmonary:     Effort: Pulmonary effort is normal. No respiratory distress.     Breath sounds: Normal breath sounds.  Abdominal:     Palpations: Abdomen is soft.     Tenderness: There is no abdominal tenderness.  Musculoskeletal:  General: Tenderness present. No swelling.     Cervical back: Neck supple.     Comments: Tenderness over lateral malleolus posterior calf distally, able to flex and extend at the ankle.  Skin:    General: Skin is warm and dry.     Capillary Refill: Capillary refill takes less than 2 seconds.     Findings: No erythema.  Neurological:     Mental Status: She is alert.  Psychiatric:        Mood and Affect: Mood normal.     ED Results / Procedures / Treatments   Labs (all labs ordered are listed, but only abnormal results are displayed) Labs Reviewed - No data to display  EKG None  Radiology US Venous Img Lower Unilateral Right  Result Date: 04/05/2022 CLINICAL DATA:  Ankle pain and swelling. EXAM: RIGHT LOWER EXTREMITY VENOUS DOPPLER ULTRASOUND  TECHNIQUE: Gray-scale sonography with compression, as well as color and duplex ultrasound, were performed to evaluate the deep venous system(s) from the level of the common femoral vein through the popliteal and proximal calf veins. COMPARISON:  None Available. FINDINGS: VENOUS Normal compressibility of the common femoral, superficial femoral, and popliteal veins, as well as the visualized calf veins. Note is made that the peroneal vein is not visualized. Visualized portions of profunda femoral vein and great saphenous vein unremarkable. No filling defects to suggest DVT on grayscale or color Doppler imaging. Doppler waveforms show normal direction of venous flow, normal respiratory plasticity and response to augmentation. Limited views of the contralateral common femoral vein are unremarkable. OTHER None. Limitations: none IMPRESSION: Negative. Electronically Signed   By: Ronney Asters M.D.   On: 04/05/2022 18:42   DG Ankle Complete Right  Result Date: 04/05/2022 CLINICAL DATA:  Ankle pain for 10 days, worsening over the last 2 days. No known injury. EXAM: RIGHT ANKLE - COMPLETE 3+ VIEW COMPARISON:  None Available. FINDINGS: The bones appear mildly demineralized. There is no evidence of acute fracture or dislocation. The ankle joint spaces are preserved. Minimal midfoot degenerative changes. Small calcaneal spurs. There is a small calcification within the plantar hindfoot soft tissues, likely related to the plantar fascia. No other focal soft tissue abnormalities are identified. IMPRESSION: No acute osseous findings or significant arthropathic changes. Small calcification in the plantar hindfoot soft tissues, likely related to the plantar fascia. Electronically Signed   By: Richardean Sale M.D.   On: 04/05/2022 15:56    Procedures Procedures    Medications Ordered in ED Medications  naproxen (NAPROSYN) tablet 375 mg (375 mg Oral Given 04/05/22 1943)    ED Course/ Medical Decision Making/ A&P                              Medical Decision Making Amount and/or Complexity of Data Reviewed Radiology: ordered.  Risk Prescription drug management.   Patient presents due to right ankle/calf pain.  Patient is neurovascular intact on exam.  Compartments are soft, palpable pulses and good cap refill.  No erythema, tolerates passive ROM and active ROM so I do not think this is septic joint, ischemic limb or compartment syndrome.    X-ray ordered in case of bony tenderness, notable for calcification of the plantar fascia but no acute process.    Given there is some posterior tenderness and atraumatic I did order ultrasound to evaluate for DVT which was negative although somewhat limited.  No history of DVTs, lower suspicion.  Suspect most likely  muscle, will have her follow-up with her primary next week as planned.  Naprosyn prescribed short-term anti-inflammatory as she has had minimal relief with Aleve, topical gels and Voltaren gel, Biofreeze and Tylenol.        Final Clinical Impression(s) / ED Diagnoses Final diagnoses:  Sprain of right ankle, unspecified ligament, initial encounter    Rx / DC Orders ED Discharge Orders          Ordered    naproxen (NAPROSYN) 375 MG tablet  2 times daily        04/05/22 1920              Sherrill Raring, PA-C 04/05/22 2306    Drenda Freeze, MD 04/06/22 [phone]

## 2022-04-05 NOTE — Discharge Instructions (Signed)
Take the Naprosyn twice daily with food and water.  Do not take at the same time as other anti-inflammatory medicines.  Can take Tylenol with this medication.  See your doctor next week for reevaluation, if the pain continues follow-up with sports medicine doctor information above you currently do not have orthopedics.  Return to the ED for dressing, shortness breath, new or concerning symptoms.

## 2022-04-10 ENCOUNTER — Other Ambulatory Visit: Payer: Self-pay

## 2022-04-10 ENCOUNTER — Emergency Department (HOSPITAL_BASED_OUTPATIENT_CLINIC_OR_DEPARTMENT_OTHER): Payer: Medicare Other

## 2022-04-10 ENCOUNTER — Emergency Department (HOSPITAL_BASED_OUTPATIENT_CLINIC_OR_DEPARTMENT_OTHER)
Admission: EM | Admit: 2022-04-10 | Discharge: 2022-04-10 | Disposition: A | Discharge status: Home / Self Care | Payer: Medicare Other | Attending: Emergency Medicine | Admitting: Emergency Medicine

## 2022-04-10 ENCOUNTER — Encounter (HOSPITAL_BASED_OUTPATIENT_CLINIC_OR_DEPARTMENT_OTHER): Payer: Self-pay | Admitting: Emergency Medicine

## 2022-04-10 DIAGNOSIS — Z79899 Other long term (current) drug therapy: Secondary | ICD-10-CM | POA: Diagnosis not present

## 2022-04-10 DIAGNOSIS — N201 Calculus of ureter: Secondary | ICD-10-CM | POA: Diagnosis not present

## 2022-04-10 DIAGNOSIS — N200 Calculus of kidney: Secondary | ICD-10-CM

## 2022-04-10 DIAGNOSIS — E119 Type 2 diabetes mellitus without complications: Secondary | ICD-10-CM | POA: Insufficient documentation

## 2022-04-10 DIAGNOSIS — Z7984 Long term (current) use of oral hypoglycemic drugs: Secondary | ICD-10-CM | POA: Insufficient documentation

## 2022-04-10 DIAGNOSIS — Z20822 Contact with and (suspected) exposure to covid-19: Secondary | ICD-10-CM | POA: Insufficient documentation

## 2022-04-10 DIAGNOSIS — I10 Essential (primary) hypertension: Secondary | ICD-10-CM | POA: Insufficient documentation

## 2022-04-10 DIAGNOSIS — R109 Unspecified abdominal pain: Secondary | ICD-10-CM | POA: Diagnosis not present

## 2022-04-10 DIAGNOSIS — N132 Hydronephrosis with renal and ureteral calculous obstruction: Secondary | ICD-10-CM | POA: Diagnosis not present

## 2022-04-10 HISTORY — DX: Prediabetes: R73.03

## 2022-04-10 HISTORY — DX: Type 2 diabetes mellitus without complications: E11.9

## 2022-04-10 LAB — LIPASE, BLOOD: Lipase: 65 U/L — ABNORMAL HIGH (ref 11–51)

## 2022-04-10 LAB — COMPREHENSIVE METABOLIC PANEL
ALT: 25 U/L (ref 0–44)
AST: 38 U/L (ref 15–41)
Albumin: 4.5 g/dL (ref 3.5–5.0)
Alkaline Phosphatase: 110 U/L (ref 38–126)
Anion gap: 9 (ref 5–15)
BUN: 17 mg/dL (ref 8–23)
CO2: 24 mmol/L (ref 22–32)
Calcium: 9.5 mg/dL (ref 8.9–10.3)
Chloride: 102 mmol/L (ref 98–111)
Creatinine, Ser: 0.88 mg/dL (ref 0.44–1.00)
GFR, Estimated: >60 mL/min (ref >60)
Glucose, Bld: 173 mg/dL — ABNORMAL HIGH (ref 70–99)
Potassium: 3.6 mmol/L (ref 3.5–5.1)
Sodium: 135 mmol/L (ref 135–145)
Total Bilirubin: 0.6 mg/dL (ref 0.3–1.2)
Total Protein: 8 g/dL (ref 6.5–8.1)

## 2022-04-10 LAB — URINALYSIS, ROUTINE W REFLEX MICROSCOPIC
Bilirubin Urine: NEGATIVE
Glucose, UA: NEGATIVE mg/dL
Ketones, ur: 15 mg/dL — AB
Leukocytes,Ua: NEGATIVE
Nitrite: NEGATIVE
Protein, ur: 100 mg/dL — AB
Specific Gravity, Urine: 1.015 (ref 1.005–1.030)
pH: 6.5 (ref 5.0–8.0)

## 2022-04-10 LAB — RESP PANEL BY RT-PCR (RSV, FLU A&B, COVID)  RVPGX2
Influenza A by PCR: NEGATIVE
Influenza B by PCR: NEGATIVE
Resp Syncytial Virus by PCR: NEGATIVE
SARS Coronavirus 2 by RT PCR: NEGATIVE

## 2022-04-10 LAB — URINALYSIS, MICROSCOPIC (REFLEX)
RBC / HPF: >50 RBC/hpf (ref 0–5)
WBC, UA: NONE SEEN WBC/hpf (ref 0–5)

## 2022-04-10 LAB — CBC
HCT: 45.6 % (ref 36.0–46.0)
Hemoglobin: 15.2 g/dL — ABNORMAL HIGH (ref 12.0–15.0)
MCH: 28.6 pg (ref 26.0–34.0)
MCHC: 33.3 g/dL (ref 30.0–36.0)
MCV: 85.7 fL (ref 80.0–100.0)
Platelets: 284 10*3/uL (ref 150–400)
RBC: 5.32 MIL/uL — ABNORMAL HIGH (ref 3.87–5.11)
RDW: 13.7 % (ref 11.5–15.5)
WBC: 14.7 10*3/uL — ABNORMAL HIGH (ref 4.0–10.5)
nRBC: 0 % (ref 0.0–0.2)

## 2022-04-10 LAB — CBG MONITORING, ED: Glucose-Capillary: 181 mg/dL — ABNORMAL HIGH (ref 70–99)

## 2022-04-10 MED ORDER — HYDROMORPHONE HCL 2 MG PO TABS: 2.0000 mg | ORAL_TABLET | Freq: Four times a day (QID) | ORAL | 0 refills | Status: DC | PRN

## 2022-04-10 MED ORDER — IOHEXOL 300 MG/ML  SOLN
100.0000 mL | Freq: Once | INTRAMUSCULAR | Status: AC | PRN
  Administered 2022-04-10: 100 mL via INTRAVENOUS

## 2022-04-10 MED ORDER — FENTANYL CITRATE PF 50 MCG/ML IJ SOSY
25.0000 ug | PREFILLED_SYRINGE | Freq: Once | INTRAMUSCULAR | Status: AC
  Administered 2022-04-10: 25 ug via INTRAVENOUS
  Filled 2022-04-10: qty 1

## 2022-04-10 MED ORDER — MORPHINE SULFATE (PF) 4 MG/ML IV SOLN
4.0000 mg | Freq: Once | INTRAVENOUS | Status: AC
  Administered 2022-04-10: 4 mg via INTRAVENOUS
  Filled 2022-04-10: qty 1

## 2022-04-10 MED ORDER — ONDANSETRON HCL 4 MG/2ML IJ SOLN
4.0000 mg | Freq: Once | INTRAMUSCULAR | Status: AC | PRN
  Administered 2022-04-10: 4 mg via INTRAVENOUS
  Filled 2022-04-10: qty 2

## 2022-04-10 MED ORDER — ONDANSETRON 4 MG PO TBDP: 4.0000 mg | ORAL_TABLET | Freq: Three times a day (TID) | ORAL | 0 refills | Status: DC | PRN

## 2022-04-10 MED ORDER — ACETAMINOPHEN 325 MG PO TABS
650.0000 mg | ORAL_TABLET | Freq: Once | ORAL | Status: AC
  Administered 2022-04-10: 650 mg via ORAL
  Filled 2022-04-10: qty 2

## 2022-04-10 MED ORDER — FENTANYL CITRATE PF 50 MCG/ML IJ SOSY
50.0000 ug | PREFILLED_SYRINGE | Freq: Once | INTRAMUSCULAR | Status: AC
  Administered 2022-04-10: 50 ug via INTRAVENOUS
  Filled 2022-04-10: qty 1

## 2022-04-10 MED ORDER — ONDANSETRON HCL 4 MG/2ML IJ SOLN: 4.0000 mg | Freq: Once | INTRAMUSCULAR | Status: DC

## 2022-04-10 MED ORDER — TAMSULOSIN HCL 0.4 MG PO CAPS: 0.4000 mg | ORAL_CAPSULE | Freq: Every day | ORAL | 0 refills | Status: DC

## 2022-04-10 MED ORDER — KETOROLAC TROMETHAMINE 15 MG/ML IJ SOLN
15.0000 mg | Freq: Once | INTRAMUSCULAR | Status: AC
  Administered 2022-04-10: 15 mg via INTRAVENOUS
  Filled 2022-04-10: qty 1

## 2022-04-10 MED ORDER — HYDROMORPHONE HCL 1 MG/ML IJ SOLN
0.5000 mg | Freq: Once | INTRAMUSCULAR | Status: AC
  Administered 2022-04-10: 0.5 mg via INTRAVENOUS
  Filled 2022-04-10: qty 1

## 2022-04-10 NOTE — ED Triage Notes (Signed)
Abdominal pain with N/V.  No known fevers at home.  Pt states it started Sunday evening.

## 2022-04-10 NOTE — ED Provider Notes (Signed)
[place] [facility] Provider Note   CSN: [id number] Arrival date & time: 04/10/22  1524     History {Add pertinent medical, surgical, social history, OB history to HPI:1} Chief Complaint  Patient presents with  . Abdominal Pain    Cheryl Stevens is a 77 y.o. female with a past medical history significant for hypertension, hyperlipidemia, diabetes, small bowel obstruction previously x 2 who presents with concern for abdominal pain, nausea, vomiting since Sunday.  Patient reports no known fever at home.  She reports her symptoms started Sunday evening.  She reports no bowel movement, no gas passage since yesterday.  She reports she does have episodes of nausea, vomiting that come intermittently, not nauseous at time my evaluation.  She also endorses history of diverticulosis and no previous history of diverticulitis.  She reports a lot of scar tissue in her abdomen secondary to hysterectomy in her 69s.   Abdominal Pain      Home Medications Prior to Admission medications   Medication Sig Start Date End Date Taking? Authorizing Provider  amLODipine (NORVASC) 2.5 MG tablet Take 2.5 mg by mouth daily.    [provider]  anastrozole (ARIMIDEX) 1 MG tablet Take 1 mg by mouth daily. 01/10/20   [provider]  B Complex-C (B-COMPLEX WITH VITAMIN C) tablet Take 1 tablet by mouth daily.    [provider]  Biotin 10 MG TABS Take 10 mg by mouth every morning.    [provider]  gabapentin (NEURONTIN) 300 MG capsule Take 300 mg by mouth 2 (two) times daily. 04/17/15   [provider]  GLIPIZIDE XL 5 MG 24 hr tablet Take 5 mg by mouth daily with breakfast.  04/17/15   [provider]  Multiple Vitamin (MULTIVITAMIN WITH MINERALS) TABS tablet Take 1 tablet by mouth daily.    [provider]  naproxen (NAPROSYN) 375 MG tablet Take 1 tablet (375 mg total) by mouth 2 (two) times daily. 04/05/22    Sherrill Raring, PA-C  omeprazole (PRILOSEC) 20 MG capsule Take 20 mg by mouth daily as needed (heartburn). 04/17/15   [provider]  rosuvastatin (CRESTOR) 10 MG tablet Take 10 mg by mouth every morning.     [provider]  saccharomyces boulardii (FLORASTOR) 250 MG capsule Take 1 capsule (250 mg total) by mouth 2 (two) times daily. 04/25/15   Elgergawy, Silver Huguenin, MD      Allergies    Lipitor [atorvastatin] and Metformin and related    Review of Systems   Review of Systems  Gastrointestinal:  Positive for abdominal pain.  All other systems reviewed and are negative.   Physical Exam Updated Vital Signs BP (!) 180/85 (BP Location: Left Arm)   Pulse 83   Temp 99.4 F (37.4 C) (Oral)   Resp 18   Ht 5\' 6"  (1.676 m)   Wt 90.7 kg   SpO2 97%   BMI 32.28 kg/m  Physical Exam Vitals and nursing note reviewed.  Constitutional:      General: She is not in acute distress.    Appearance: Normal appearance.  HENT:     Head: Normocephalic and atraumatic.  Eyes:     General:        Right eye: No discharge.        Left eye: No discharge.  Cardiovascular:     Rate and Rhythm: Normal rate and regular rhythm.     Heart sounds: No murmur heard.  No friction rub. No gallop.  Pulmonary:     Effort: Pulmonary effort is normal.     Breath sounds: Normal breath sounds.  Abdominal:     General: Bowel sounds are normal.     Palpations: Abdomen is soft.     Comments: Patient with focal tenderness to palpation worst in the epigastric and left lower quadrant region, no rebound, rigidity, guarding.  Skin:    General: Skin is warm and dry.     Capillary Refill: Capillary refill takes less than 2 seconds.  Neurological:     Mental Status: She is alert and oriented to person, place, and time.  Psychiatric:        Mood and Affect: Mood normal.        Behavior: Behavior normal.    ED Results / Procedures / Treatments   Labs (all labs ordered are listed, but only abnormal  results are displayed) Labs Reviewed  CBG MONITORING, ED - Abnormal; Notable for the following components:      Result Value   Glucose-Capillary 181 (*)    All other components within normal limits  RESP PANEL BY RT-PCR (RSV, FLU A&B, COVID)  [id number]  LIPASE, BLOOD  COMPREHENSIVE METABOLIC PANEL  CBC  URINALYSIS, ROUTINE W REFLEX MICROSCOPIC  CBG MONITORING, ED    EKG None  Radiology No results found.  Procedures Procedures  {Document cardiac monitor, telemetry assessment procedure when appropriate:1}  Medications Ordered in ED Medications  ondansetron (ZOFRAN) injection 4 mg (has no administration in time range)  acetaminophen (TYLENOL) tablet 650 mg (has no administration in time range)  morphine (PF) 4 MG/ML injection 4 mg (has no administration in time range)    ED Course/ Medical Decision Making/ A&P   {   Click here for ABCD2, HEART and other calculatorsREFRESH Note before signing :1}                          Medical Decision Making Amount and/or Complexity of Data Reviewed Labs: ordered. Radiology: ordered.  Risk OTC drugs. Prescription drug management.   This patient is a 77 y.o. female  who presents to the ED for concern of ***.   Differential diagnoses prior to evaluation: The emergent differential diagnosis includes, but is not limited to,  *** . This is not an exhaustive differential.   Past Medical History / Co-morbidities: ***  Additional history: Chart reviewed. Pertinent results include: ***  Physical Exam: Physical exam performed. The pertinent findings include: ***  Lab Tests/Imaging studies: I personally interpreted labs/imaging and the pertinent results include:  ***. ***I agree with the radiologist interpretation.  Cardiac monitoring: EKG obtained and interpreted by my attending physician which shows: ***   Medications: I ordered medication including ***.  I have reviewed the patients home medicines and have made adjustments as  needed.   Disposition: After consideration of the diagnostic results and the patients response to treatment, I feel that *** .   ***emergency department workup does not suggest an emergent condition requiring admission or immediate intervention beyond what has been performed at this time. The plan is: ***. The patient is safe for discharge and has been instructed to return immediately for worsening symptoms, change in symptoms or any other concerns.  Final Clinical Impression(s) / ED Diagnoses Final diagnoses:  None    Rx / DC Orders ED Discharge Orders     None

## 2022-04-10 NOTE — ED Notes (Signed)
Patient not nauseated at this time and does not require Zofran.

## 2022-04-10 NOTE — ED Notes (Signed)
Consult placed on hold @6 :05pm.  PA has requested Urine Specimen results prior to consult

## 2022-04-10 NOTE — ED Notes (Signed)
Discharge paperwork reviewed entirely with patient, including Rx's and follow up care. Pain was under control. Pt verbalized understanding as well as all parties involved. No questions or concerns voiced at the time of discharge. No acute distress noted.   Pt ambulated out to PVA without incident or assistance.  

## 2022-04-10 NOTE — ED Provider Notes (Signed)
Signout received on this 77 year old female who is awaiting UA at the time of signout. Physical Exam  BP (!) 175/81 (BP Location: Left Arm)   Pulse 91   Temp 98.9 F (37.2 C) (Oral)   Resp 18   Ht 5\' 6"  (1.676 m)   Wt 90.7 kg   SpO2 99%   BMI 32.28 kg/m   Physical Exam  Procedures  Procedures  ED Course / MDM    Medical Decision Making Amount and/or Complexity of Data Reviewed Labs: ordered. Radiology: ordered.  Risk OTC drugs. Prescription drug management.   She has a 9 mm obstructing kidney stone.  Discussed with urology given uncontrolled pain.  Urologist offered either stent placement tonight and removal in a few weeks versus follow-up outpatient tomorrow and only require 1 procedure.  Following this discussion patient would prefer to follow-up outpatient because she would only like to have 1 procedure done.  Will send home with p.o. Dilaudid 2 mg, Zofran, and Flomax.  Patient is in agreement with this plan.  No evidence of infected stone.  Renal function preserved.       Evlyn Courier, PA-C 04/10/22 2114    Gareth Morgan, MD 04/11/22 1430

## 2022-04-10 NOTE — ED Notes (Signed)
Patient attempting to provide urine sample at this time.

## 2022-04-10 NOTE — Discharge Instructions (Addendum)
Your workup showed 9 mm kidney stone.  You received some comfort through the medications received in the ED.  Discussed your case with the urologist who recommended either placing a stent tonight and another procedure later versus following up outpatient which may require 1 procedure.  You prefer to follow-up outpatient.  FC medications into the pharmacy for you.  For any concerning symptoms return to the emergency room otherwise please call his clinic tomorrow morning.

## 2022-04-11 ENCOUNTER — Ambulatory Visit: Payer: Self-pay | Admitting: Urology

## 2022-04-11 ENCOUNTER — Encounter (HOSPITAL_BASED_OUTPATIENT_CLINIC_OR_DEPARTMENT_OTHER): Payer: Self-pay | Admitting: Emergency Medicine

## 2022-04-11 ENCOUNTER — Emergency Department (HOSPITAL_COMMUNITY): Payer: Medicare Other

## 2022-04-11 ENCOUNTER — Inpatient Hospital Stay: Admit: 2022-04-11 | Payer: PRIVATE HEALTH INSURANCE | Admitting: Urology

## 2022-04-11 ENCOUNTER — Encounter (HOSPITAL_COMMUNITY)
Admission: EM | Disposition: A | Discharge status: Home / Self Care | Payer: Self-pay | Source: Home / Self Care | Attending: Emergency Medicine

## 2022-04-11 ENCOUNTER — Ambulatory Visit (HOSPITAL_BASED_OUTPATIENT_CLINIC_OR_DEPARTMENT_OTHER)
Admission: EM | Admit: 2022-04-11 | Discharge: 2022-04-11 | Disposition: A | Discharge status: Home / Self Care | Payer: Medicare Other | Attending: Urology | Admitting: Urology

## 2022-04-11 ENCOUNTER — Other Ambulatory Visit: Payer: Self-pay | Admitting: Urology

## 2022-04-11 ENCOUNTER — Emergency Department (HOSPITAL_COMMUNITY): Payer: Medicare Other | Admitting: Anesthesiology

## 2022-04-11 ENCOUNTER — Emergency Department (EMERGENCY_DEPARTMENT_HOSPITAL): Payer: Medicare Other | Admitting: Anesthesiology

## 2022-04-11 DIAGNOSIS — N201 Calculus of ureter: Secondary | ICD-10-CM | POA: Diagnosis not present

## 2022-04-11 DIAGNOSIS — N200 Calculus of kidney: Secondary | ICD-10-CM

## 2022-04-11 DIAGNOSIS — R109 Unspecified abdominal pain: Secondary | ICD-10-CM | POA: Diagnosis not present

## 2022-04-11 DIAGNOSIS — C801 Malignant (primary) neoplasm, unspecified: Secondary | ICD-10-CM

## 2022-04-11 DIAGNOSIS — I10 Essential (primary) hypertension: Secondary | ICD-10-CM | POA: Diagnosis not present

## 2022-04-11 DIAGNOSIS — N132 Hydronephrosis with renal and ureteral calculous obstruction: Secondary | ICD-10-CM

## 2022-04-11 DIAGNOSIS — Z7984 Long term (current) use of oral hypoglycemic drugs: Secondary | ICD-10-CM

## 2022-04-11 DIAGNOSIS — E119 Type 2 diabetes mellitus without complications: Secondary | ICD-10-CM | POA: Diagnosis not present

## 2022-04-11 HISTORY — PX: CYSTOSCOPY W/ URETERAL STENT PLACEMENT: SHX1429

## 2022-04-11 HISTORY — DX: Malignant (primary) neoplasm, unspecified: C80.1

## 2022-04-11 LAB — BASIC METABOLIC PANEL
Anion gap: 10 (ref 5–15)
BUN: 25 mg/dL — ABNORMAL HIGH (ref 8–23)
CO2: 24 mmol/L (ref 22–32)
Calcium: 9 mg/dL (ref 8.9–10.3)
Chloride: 102 mmol/L (ref 98–111)
Creatinine, Ser: 1.06 mg/dL — ABNORMAL HIGH (ref 0.44–1.00)
GFR, Estimated: 55 mL/min — ABNORMAL LOW (ref >60)
Glucose, Bld: 115 mg/dL — ABNORMAL HIGH (ref 70–99)
Potassium: 3.6 mmol/L (ref 3.5–5.1)
Sodium: 136 mmol/L (ref 135–145)

## 2022-04-11 LAB — CBC WITH DIFFERENTIAL/PLATELET
Abs Immature Granulocytes: 0.08 10*3/uL — ABNORMAL HIGH (ref 0.00–0.07)
Basophils Absolute: 0.1 10*3/uL (ref 0.0–0.1)
Basophils Relative: 1 %
Eosinophils Absolute: 0 10*3/uL (ref 0.0–0.5)
Eosinophils Relative: 0 %
HCT: 43.2 % (ref 36.0–46.0)
Hemoglobin: 14.7 g/dL (ref 12.0–15.0)
Immature Granulocytes: 1 %
Lymphocytes Relative: 17 %
Lymphs Abs: 2.4 10*3/uL (ref 0.7–4.0)
MCH: 29.2 pg (ref 26.0–34.0)
MCHC: 34 g/dL (ref 30.0–36.0)
MCV: 85.9 fL (ref 80.0–100.0)
Monocytes Absolute: 1.1 10*3/uL — ABNORMAL HIGH (ref 0.1–1.0)
Monocytes Relative: 8 %
Neutro Abs: 10.8 10*3/uL — ABNORMAL HIGH (ref 1.7–7.7)
Neutrophils Relative %: 73 %
Platelets: 247 10*3/uL (ref 150–400)
RBC: 5.03 MIL/uL (ref 3.87–5.11)
RDW: 13.7 % (ref 11.5–15.5)
WBC: 14.4 10*3/uL — ABNORMAL HIGH (ref 4.0–10.5)
nRBC: 0 % (ref 0.0–0.2)

## 2022-04-11 LAB — GLUCOSE, CAPILLARY
Glucose-Capillary: 118 mg/dL — ABNORMAL HIGH (ref 70–99)
Glucose-Capillary: 127 mg/dL — ABNORMAL HIGH (ref 70–99)

## 2022-04-11 SURGERY — CYSTOSCOPY, WITH RETROGRADE PYELOGRAM AND URETERAL STENT INSERTION
Anesthesia: General | Laterality: Left

## 2022-04-11 MED ORDER — ONDANSETRON HCL 4 MG/2ML IJ SOLN
INTRAMUSCULAR | Status: AC
  Filled 2022-04-11: qty 2

## 2022-04-11 MED ORDER — LIDOCAINE HCL URETHRAL/MUCOSAL 2 % EX GEL
CUTANEOUS | Status: DC | PRN
  Administered 2022-04-11: 1 via URETHRAL

## 2022-04-11 MED ORDER — ONDANSETRON HCL 4 MG/2ML IJ SOLN
INTRAMUSCULAR | Status: DC | PRN
  Administered 2022-04-11: 4 mg via INTRAVENOUS

## 2022-04-11 MED ORDER — HYDROMORPHONE HCL 1 MG/ML IJ SOLN
INTRAMUSCULAR | Status: AC
  Filled 2022-04-11: qty 1

## 2022-04-11 MED ORDER — PHENAZOPYRIDINE HCL 200 MG PO TABS: 200.0000 mg | ORAL_TABLET | Freq: Three times a day (TID) | ORAL | 0 refills | Status: AC | PRN

## 2022-04-11 MED ORDER — KETOROLAC TROMETHAMINE 30 MG/ML IJ SOLN
INTRAMUSCULAR | Status: AC
  Filled 2022-04-11: qty 1

## 2022-04-11 MED ORDER — SUCCINYLCHOLINE CHLORIDE 200 MG/10ML IV SOSY
PREFILLED_SYRINGE | INTRAVENOUS | Status: DC | PRN
  Administered 2022-04-11: 140 mg via INTRAVENOUS

## 2022-04-11 MED ORDER — KETOROLAC TROMETHAMINE 15 MG/ML IJ SOLN
INTRAMUSCULAR | Status: DC | PRN
  Administered 2022-04-11: 15 mg via INTRAVENOUS

## 2022-04-11 MED ORDER — HYDROMORPHONE HCL 1 MG/ML IJ SOLN
0.2500 mg | INTRAMUSCULAR | Status: DC | PRN
  Administered 2022-04-11 (×2): 0.5 mg via INTRAVENOUS

## 2022-04-11 MED ORDER — SUCCINYLCHOLINE CHLORIDE 200 MG/10ML IV SOSY
PREFILLED_SYRINGE | INTRAVENOUS | Status: AC
  Filled 2022-04-11: qty 10

## 2022-04-11 MED ORDER — CEFAZOLIN SODIUM-DEXTROSE 2-4 GM/100ML-% IV SOLN
2.0000 g | INTRAVENOUS | Status: AC
  Administered 2022-04-11: 2 g via INTRAVENOUS
  Filled 2022-04-11: qty 100

## 2022-04-11 MED ORDER — PROPOFOL 10 MG/ML IV BOLUS
INTRAVENOUS | Status: AC
  Filled 2022-04-11: qty 20

## 2022-04-11 MED ORDER — CEFDINIR 300 MG PO CAPS: 300.0000 mg | ORAL_CAPSULE | Freq: Two times a day (BID) | ORAL | 0 refills | Status: AC

## 2022-04-11 MED ORDER — ACETAMINOPHEN 10 MG/ML IV SOLN: 1000.0000 mg | Freq: Four times a day (QID) | INTRAVENOUS | Status: DC

## 2022-04-11 MED ORDER — FENTANYL CITRATE PF 50 MCG/ML IJ SOSY
50.0000 ug | PREFILLED_SYRINGE | Freq: Once | INTRAMUSCULAR | Status: AC
  Administered 2022-04-11: 50 ug via INTRAVENOUS

## 2022-04-11 MED ORDER — LIDOCAINE 2% (20 MG/ML) 5 ML SYRINGE
INTRAMUSCULAR | Status: DC | PRN
  Administered 2022-04-11: 60 mg via INTRAVENOUS

## 2022-04-11 MED ORDER — DEXAMETHASONE SODIUM PHOSPHATE 10 MG/ML IJ SOLN
INTRAMUSCULAR | Status: AC
  Filled 2022-04-11: qty 1

## 2022-04-11 MED ORDER — SODIUM CHLORIDE 0.9 % IV BOLUS
1000.0000 mL | Freq: Once | INTRAVENOUS | Status: AC
  Administered 2022-04-11: 1000 mL via INTRAVENOUS

## 2022-04-11 MED ORDER — LIDOCAINE HCL (PF) 2 % IJ SOLN
INTRAMUSCULAR | Status: AC
  Filled 2022-04-11: qty 5

## 2022-04-11 MED ORDER — LIDOCAINE HCL URETHRAL/MUCOSAL 2 % EX GEL
CUTANEOUS | Status: AC
  Filled 2022-04-11: qty 30

## 2022-04-11 MED ORDER — ORAL CARE MOUTH RINSE
15.0000 mL | Freq: Once | OROMUCOSAL | Status: AC
  Administered 2022-04-11: 15 mL via OROMUCOSAL

## 2022-04-11 MED ORDER — PROPOFOL 10 MG/ML IV BOLUS
INTRAVENOUS | Status: DC | PRN
  Administered 2022-04-11: 200 mg via INTRAVENOUS

## 2022-04-11 MED ORDER — OXYCODONE HCL 5 MG PO TABS
ORAL_TABLET | ORAL | Status: AC
  Filled 2022-04-11: qty 1

## 2022-04-11 MED ORDER — FENTANYL CITRATE PF 50 MCG/ML IJ SOSY
50.0000 ug | PREFILLED_SYRINGE | Freq: Once | INTRAMUSCULAR | Status: AC
  Administered 2022-04-11: 50 ug via INTRAVENOUS
  Filled 2022-04-11: qty 1

## 2022-04-11 MED ORDER — LACTATED RINGERS IV SOLN: INTRAVENOUS | Status: DC

## 2022-04-11 MED ORDER — FENTANYL CITRATE (PF) 100 MCG/2ML IJ SOLN
INTRAMUSCULAR | Status: AC
  Filled 2022-04-11: qty 2

## 2022-04-11 MED ORDER — ACETAMINOPHEN 500 MG PO TABS
1000.0000 mg | ORAL_TABLET | Freq: Once | ORAL | Status: DC
  Filled 2022-04-11: qty 2

## 2022-04-11 MED ORDER — IOHEXOL 300 MG/ML  SOLN
INTRAMUSCULAR | Status: DC | PRN
  Administered 2022-04-11: 10 mL

## 2022-04-11 MED ORDER — FENTANYL CITRATE (PF) 100 MCG/2ML IJ SOLN
INTRAMUSCULAR | Status: DC | PRN
  Administered 2022-04-11: 100 ug via INTRAVENOUS

## 2022-04-11 MED ORDER — 0.9 % SODIUM CHLORIDE (POUR BTL) OPTIME
TOPICAL | Status: DC | PRN
  Administered 2022-04-11: 1000 mL

## 2022-04-11 MED ORDER — AMISULPRIDE (ANTIEMETIC) 5 MG/2ML IV SOLN
10.0000 mg | Freq: Once | INTRAVENOUS | Status: AC | PRN
  Administered 2022-04-11: 10 mg via INTRAVENOUS

## 2022-04-11 MED ORDER — FENTANYL CITRATE PF 50 MCG/ML IJ SOSY: 50.0000 ug | PREFILLED_SYRINGE | Freq: Once | INTRAMUSCULAR | Status: AC

## 2022-04-11 MED ORDER — SODIUM CHLORIDE 0.9 % IR SOLN
Status: DC | PRN
  Administered 2022-04-11: 6000 mL

## 2022-04-11 MED ORDER — ONDANSETRON HCL 4 MG/2ML IJ SOLN: 4.0000 mg | Freq: Once | INTRAMUSCULAR | Status: DC | PRN

## 2022-04-11 MED ORDER — AMISULPRIDE (ANTIEMETIC) 5 MG/2ML IV SOLN
INTRAVENOUS | Status: AC
  Filled 2022-04-11: qty 4

## 2022-04-11 MED ORDER — FENTANYL CITRATE PF 50 MCG/ML IJ SOSY
PREFILLED_SYRINGE | INTRAMUSCULAR | Status: AC
  Administered 2022-04-11: 50 ug via INTRAVENOUS
  Filled 2022-04-11: qty 1

## 2022-04-11 MED ORDER — ACETAMINOPHEN 10 MG/ML IV SOLN
INTRAVENOUS | Status: AC
  Administered 2022-04-11: 1000 mg via INTRAVENOUS
  Filled 2022-04-11: qty 100

## 2022-04-11 MED ORDER — DEXAMETHASONE SODIUM PHOSPHATE 10 MG/ML IJ SOLN
INTRAMUSCULAR | Status: DC | PRN
  Administered 2022-04-11: 5 mg via INTRAVENOUS

## 2022-04-11 MED ORDER — ONDANSETRON HCL 4 MG/2ML IJ SOLN
4.0000 mg | Freq: Once | INTRAMUSCULAR | Status: AC
  Administered 2022-04-11: 4 mg via INTRAVENOUS
  Filled 2022-04-11: qty 2

## 2022-04-11 MED ORDER — OXYCODONE HCL 5 MG PO TABS
5.0000 mg | ORAL_TABLET | Freq: Once | ORAL | Status: AC | PRN
  Administered 2022-04-11: 5 mg via ORAL

## 2022-04-11 MED ORDER — OXYCODONE HCL 5 MG/5ML PO SOLN: 5.0000 mg | Freq: Once | ORAL | Status: AC | PRN

## 2022-04-11 MED ORDER — FENTANYL CITRATE PF 50 MCG/ML IJ SOSY
PREFILLED_SYRINGE | INTRAMUSCULAR | Status: AC
  Filled 2022-04-11: qty 1

## 2022-04-11 MED ORDER — ACETAMINOPHEN 10 MG/ML IV SOLN: 1000.0000 mg | Freq: Once | INTRAVENOUS | Status: AC

## 2022-04-11 MED ORDER — ROCURONIUM BROMIDE 10 MG/ML (PF) SYRINGE
PREFILLED_SYRINGE | INTRAVENOUS | Status: AC
  Filled 2022-04-11: qty 10

## 2022-04-11 MED ORDER — CHLORHEXIDINE GLUCONATE 0.12 % MT SOLN: 15.0000 mL | Freq: Once | OROMUCOSAL | Status: AC

## 2022-04-11 SURGICAL SUPPLY — 22 items
BAG COUNTER SPONGE SURGICOUNT (BAG) IMPLANT
BAG URO CATCHER STRL LF (MISCELLANEOUS) ×1 IMPLANT
BASKET ZERO TIP NITINOL 2.4FR (BASKET) IMPLANT
CATH URETL OPEN 5X70 (CATHETERS) IMPLANT
CLOTH BEACON ORANGE TIMEOUT ST (SAFETY) ×1 IMPLANT
GLOVE SS BIOGEL STRL SZ 8 (GLOVE) ×1 IMPLANT
GOWN STRL REUS W/ TWL XL LVL3 (GOWN DISPOSABLE) ×1 IMPLANT
GOWN STRL REUS W/TWL XL LVL3 (GOWN DISPOSABLE) ×1
GUIDEWIRE STR DUAL SENSOR (WIRE) ×1 IMPLANT
GUIDEWIRE ZIPWRE .038 STRAIGHT (WIRE) IMPLANT
IV NS 1000ML (IV SOLUTION) ×1
IV NS 1000ML BAXH (IV SOLUTION) ×1 IMPLANT
KIT TURNOVER KIT A (KITS) IMPLANT
LASER FIB FLEXIVA PULSE ID 365 (Laser) IMPLANT
MANIFOLD NEPTUNE II (INSTRUMENTS) ×1 IMPLANT
PACK CYSTO (CUSTOM PROCEDURE TRAY) ×1 IMPLANT
SHEATH NAVIGATOR HD 12/14X36 (SHEATH) IMPLANT
STENT URET 6FRX26 CONTOUR (STENTS) IMPLANT
TRACTIP FLEXIVA PULS ID 200XHI (Laser) IMPLANT
TRACTIP FLEXIVA PULSE ID 200 (Laser)
TUBING CONNECTING 10 (TUBING) ×1 IMPLANT
TUBING UROLOGY SET (TUBING) ×1 IMPLANT

## 2022-04-11 NOTE — ED Notes (Signed)
Pt difficult IV stick. EDP placed US guided #20 1.88" in left lower forearm

## 2022-04-11 NOTE — Discharge Instructions (Addendum)
Go to [facility] and check into short stay.  Tell them that you are there for procedure for kidney stone.

## 2022-04-11 NOTE — Consult Note (Signed)
Urology Consult   Physician requesting consult: Lennice Sites, DO  Reason for consult: Left ureteral stone, flank pain  History of Present Illness: Cheryl Stevens is a 77 y.o. female seen in consultation from Dr. Ronnald Nian for evaluation of a proximal left ureteral calculus and flank pain.  She presented to the emergency room yesterday with left-sided abdominal pain with associated nausea and vomiting.  Her symptoms began approximately 4 days prior.  No fever or chills.  She has had minimal p.o. intake secondary to the nausea and vomiting.  No dysuria or gross hematuria. CT imaging from 04/10/2022 showed a 9 mm calculus in the proximal left ureter with associated left hydronephrosis.  No other stones were seen. Urinalysis showed >50 RBCs, rare bacteria. White blood cell count elevated at 14.7.  Creatinine normal at 0.88.  She has a prior history of nephrolithiasis and has passed several stones previously.  She has never required surgical intervention.  Past Medical History:  Diagnosis Date   Cancer ([place])    Diabetes mellitus without complication ([place])    Essential hypertension 04/29/2020   High cholesterol    Hypertension    Prediabetes     Past Surgical History:  Procedure Laterality Date   ABDOMINAL HYSTERECTOMY      Medications:  Scheduled Meds:  fentaNYL (SUBLIMAZE) injection  50 mcg Intravenous Once   Continuous Infusions:  sodium chloride     PRN Meds:.  Allergies:  Allergies  Allergen Reactions   Lipitor [Atorvastatin] Other (See Comments)    Muscle cramps   Metformin And Related Other (See Comments)    Leg pain    Family History  Problem Relation Age of Onset   Heart disease Neg Hx     Social History:  reports that she has never smoked. She has never used smokeless tobacco. She reports current alcohol use. She reports that she does not use drugs.  ROS: A complete review of systems was performed.  All systems are negative except for pertinent findings as  noted.  Physical Exam:  Vital signs in last 24 hours: Temp:  [98.2 F (36.8 C)-99.4 F (37.4 C)] 98.2 F (36.8 C) (03/20 1053) Pulse Rate:  [78-91] 78 (03/20 1053) Resp:  [18-20] 20 (03/20 1053) BP: (175-180)/(81-86) 176/86 (03/20 1053) SpO2:  [96 %-99 %] 96 % (03/20 1053) Weight:  [90.7 kg] 90.7 kg (03/20 1051) GENERAL APPEARANCE:  Well appearing, well developed, well nourished, NAD HEENT: Atraumatic, Normocephalic, oropharynx clear. NECK: Supple without lymphadenopathy or thyromegaly. LUNGS: Clear to auscultation bilaterally. HEART: Regular Rate and Rhythm without murmurs, gallops, or rubs. ABDOMEN: Soft, non-tender, No Masses. EXTREMITIES: Moves all extremities well.  Without clubbing, cyanosis, or edema. NEUROLOGIC:  Alert and oriented x 3, normal gait, CN II-XII grossly intact.  MENTAL STATUS:  Appropriate. BACK:  Non-tender to palpation.  No CVAT SKIN:  Warm, dry and intact.    Laboratory Data:  Recent Labs    04/10/22 1620  WBC 14.7*  HGB 15.2*  HCT 45.6  PLT 284    Recent Labs    04/10/22 1620  NA 135  K 3.6  CL 102  GLUCOSE 173*  BUN 17  CALCIUM 9.5  CREATININE 0.88     Results for orders placed or performed during the hospital encounter of 04/10/22 (from the past 24 hour(s))  CBG monitoring, ED     Status: Abnormal   Collection Time: 04/10/22  4:04 PM  Result Value Ref Range   Glucose-Capillary 181 (H) 70 - 99 mg/dL  Comment 1 Notify RN   Resp panel by RT-PCR (RSV, Flu A&B, Covid) Nasopharyngeal Swab     Status: None   Collection Time: 04/10/22  4:06 PM   Specimen: Nasopharyngeal Swab; Nasal Swab  Result Value Ref Range   SARS Coronavirus 2 by RT PCR NEGATIVE NEGATIVE   Influenza A by PCR NEGATIVE NEGATIVE   Influenza B by PCR NEGATIVE NEGATIVE   Resp Syncytial Virus by PCR NEGATIVE NEGATIVE  Lipase, blood     Status: Abnormal   Collection Time: 04/10/22  4:20 PM  Result Value Ref Range   Lipase 65 (H) 11 - 51 U/L  Comprehensive metabolic  panel     Status: Abnormal   Collection Time: 04/10/22  4:20 PM  Result Value Ref Range   Sodium 135 135 - 145 mmol/L   Potassium 3.6 3.5 - 5.1 mmol/L   Chloride 102 98 - 111 mmol/L   CO2 24 22 - 32 mmol/L   Glucose, Bld 173 (H) 70 - 99 mg/dL   BUN 17 8 - 23 mg/dL   Creatinine, Ser 0.88 0.44 - 1.00 mg/dL   Calcium 9.5 8.9 - 10.3 mg/dL   Total Protein 8.0 6.5 - 8.1 g/dL   Albumin 4.5 3.5 - 5.0 g/dL   AST 38 15 - 41 U/L   ALT 25 0 - 44 U/L   Alkaline Phosphatase 110 38 - 126 U/L   Total Bilirubin 0.6 0.3 - 1.2 mg/dL   GFR, Estimated >60 >60 mL/min   Anion gap 9 5 - 15  CBC     Status: Abnormal   Collection Time: 04/10/22  4:20 PM  Result Value Ref Range   WBC 14.7 (H) 4.0 - 10.5 K/uL   RBC 5.32 (H) 3.87 - 5.11 MIL/uL   Hemoglobin 15.2 (H) 12.0 - 15.0 g/dL   HCT 45.6 36.0 - 46.0 %   MCV 85.7 80.0 - 100.0 fL   MCH 28.6 26.0 - 34.0 pg   MCHC 33.3 30.0 - 36.0 g/dL   RDW 13.7 11.5 - 15.5 %   Platelets 284 150 - 400 K/uL   nRBC 0.0 0.0 - 0.2 %  Urinalysis, Routine w reflex microscopic -Urine, Clean Catch     Status: Abnormal   Collection Time: 04/10/22  6:30 PM  Result Value Ref Range   Color, Urine BROWN (A) YELLOW   APPearance CLOUDY (A) CLEAR   Specific Gravity, Urine 1.015 1.005 - 1.030   pH 6.5 5.0 - 8.0   Glucose, UA NEGATIVE NEGATIVE mg/dL   Hgb urine dipstick LARGE (A) NEGATIVE   Bilirubin Urine NEGATIVE NEGATIVE   Ketones, ur 15 (A) NEGATIVE mg/dL   Protein, ur 100 (A) NEGATIVE mg/dL   Nitrite NEGATIVE NEGATIVE   Leukocytes,Ua NEGATIVE NEGATIVE  Urinalysis, Microscopic (reflex)     Status: Abnormal   Collection Time: 04/10/22  6:30 PM  Result Value Ref Range   RBC / HPF >50 0 - 5 RBC/hpf   WBC, UA NONE SEEN 0 - 5 WBC/hpf   Bacteria, UA RARE (A) NONE SEEN   Squamous Epithelial / HPF 0-5 0 - 5 /HPF   Recent Results (from the past 240 hour(s))  Resp panel by RT-PCR (RSV, Flu A&B, Covid) Nasopharyngeal Swab     Status: None   Collection Time: 04/10/22  4:06 PM    Specimen: Nasopharyngeal Swab; Nasal Swab  Result Value Ref Range Status   SARS Coronavirus 2 by RT PCR NEGATIVE NEGATIVE Final    Comment: (NOTE) SARS-CoV-2  target nucleic acids are NOT DETECTED.  The SARS-CoV-2 RNA is generally detectable in upper respiratory specimens during the acute phase of infection. The lowest concentration of SARS-CoV-2 viral copies this assay can detect is 138 copies/mL. A negative result does not preclude SARS-Cov-2 infection and should not be used as the sole basis for treatment or other patient management decisions. A negative result may occur with  improper specimen collection/handling, submission of specimen other than nasopharyngeal swab, presence of viral mutation(s) within the areas targeted by this assay, and inadequate number of viral copies(<138 copies/mL). A negative result must be combined with clinical observations, patient history, and epidemiological information. The expected result is Negative.  Fact Sheet for Patients:  EntrepreneurPulse.com.au  Fact Sheet for Healthcare Providers:  IncredibleEmployment.be  This test is no t yet approved or cleared by the [place] FDA and  has been authorized for detection and/or diagnosis of SARS-CoV-2 by FDA under an Emergency Use Authorization (EUA). This EUA will remain  in effect (meaning this test can be used) for the duration of the COVID-19 declaration under Section 564(b)(1) of the Act, 21 U.S.C.section 360bbb-3(b)(1), unless the authorization is terminated  or revoked sooner.       Influenza A by PCR NEGATIVE NEGATIVE Final   Influenza B by PCR NEGATIVE NEGATIVE Final    Comment: (NOTE) The Xpert Xpress SARS-CoV-2/FLU/RSV plus assay is intended as an aid in the diagnosis of influenza from Nasopharyngeal swab specimens and should not be used as a sole basis for treatment. Nasal washings and aspirates are unacceptable for Xpert Xpress  SARS-CoV-2/FLU/RSV testing.  Fact Sheet for Patients: EntrepreneurPulse.com.au  Fact Sheet for Healthcare Providers: IncredibleEmployment.be  This test is not yet approved or cleared by the [place] FDA and has been authorized for detection and/or diagnosis of SARS-CoV-2 by FDA under an Emergency Use Authorization (EUA). This EUA will remain in effect (meaning this test can be used) for the duration of the COVID-19 declaration under Section 564(b)(1) of the Act, 21 U.S.C. section 360bbb-3(b)(1), unless the authorization is terminated or revoked.     Resp Syncytial Virus by PCR NEGATIVE NEGATIVE Final    Comment: (NOTE) Fact Sheet for Patients: EntrepreneurPulse.com.au  Fact Sheet for Healthcare Providers: IncredibleEmployment.be  This test is not yet approved or cleared by the [place] FDA and has been authorized for detection and/or diagnosis of SARS-CoV-2 by FDA under an Emergency Use Authorization (EUA). This EUA will remain in effect (meaning this test can be used) for the duration of the COVID-19 declaration under Section 564(b)(1) of the Act, 21 U.S.C. section 360bbb-3(b)(1), unless the authorization is terminated or revoked.  Performed at [facility], [address] [place], [place] [address]     Renal Function: Recent Labs    04/10/22 1620  CREATININE 0.88   Estimated Creatinine Clearance: 62.7 mL/min (by C-G formula based on SCr of 0.88 mg/dL).  Radiologic Imaging: CT ABDOMEN PELVIS W CONTRAST  Result Date: 04/10/2022 CLINICAL DATA:  Abdominal pain. EXAM: CT ABDOMEN AND PELVIS WITH CONTRAST TECHNIQUE: Multidetector CT imaging of the abdomen and pelvis was performed using the standard protocol following bolus administration of intravenous contrast. RADIATION DOSE REDUCTION: This exam was performed according to the departmental dose-optimization program which includes  automated exposure control, adjustment of the mA and/or kV according to patient size and/or use of iterative reconstruction technique. CONTRAST:  153mL OMNIPAQUE IOHEXOL 300 MG/ML  SOLN COMPARISON:  April 29, 2020 FINDINGS: Lower chest: No acute abnormality. Hepatobiliary: There is  diffuse fatty infiltration of the liver parenchyma. A stable 16 mm x 11 mm focus of parenchymal low attenuation is seen within the left lobe. A thin layer of tiny gallstones is seen within the dependent portion of an otherwise normal-appearing gallbladder. Pancreas: Unremarkable. No pancreatic ductal dilatation or surrounding inflammatory changes. Spleen: Normal in size without focal abnormality. Adrenals/Urinary Tract: Adrenal glands are unremarkable. Kidneys are normal in size, without focal lesions. A 9 mm obstructing renal calculus is seen within the proximal left ureter, near the left UPJ. There is mild left-sided hydronephrosis, hydroureter and perinephric inflammatory fat stranding. Bladder is unremarkable. Stomach/Bowel: Stomach is within normal limits. The appendix is not clearly identified. No evidence of bowel wall thickening, distention, or inflammatory changes. Noninflamed diverticula are seen throughout the large bowel. Vascular/Lymphatic: Aortic atherosclerosis. No enlarged abdominal or pelvic lymph nodes. Reproductive: Status post hysterectomy. No adnexal masses. Other: No abdominal wall hernia or abnormality. No abdominopelvic ascites. Musculoskeletal: Multilevel degenerative changes are seen throughout the lumbar spine. IMPRESSION: 1. 9 mm obstructing renal calculus within the proximal left ureter. 2. Colonic diverticulosis. 3. Hepatic steatosis. 4. Cholelithiasis. 5. Aortic atherosclerosis. Aortic Atherosclerosis (ICD10-I70.0). Electronically Signed   By: Virgina Norfolk M.D.   On: 04/10/2022 17:38    I independently reviewed the above imaging studies.  Impression/Recommendation Left proximal ureteral  calculus Left flank pain  I reviewed the findings on the CT scan with the patient and her husband.  Options for management of the proximal left ureteral calculus discussed including cystoscopy with stent insertion, shockwave lithotripsy, and ureteroscopic laser lithotripsy.  Risk and benefits reviewed.  Given the stone size and location, recommend management with cystoscopy and stent insertion followed by shockwave lithotripsy in the near future.   Risks, benefits, alternatives were discussed with the patient in detail.  Potential risks including, but not limited to, infection; bleeding;  injury to urethra, bladder, or ureter; possible need of other treatments; possible failure to remove the calculus; ureteral  stricture formation; cardiac, pulmonary, cerebrovascular events; and anesthetic complications were discussed.  The patient understands and wishes to proceed.  Procedure: The patient will be scheduled for cystoscopy, bilateral retrograde pyelograms, left ureteral stent insertion  at [facility].  Surgical request is placed with the surgery schedulers and will be scheduled at the patient's/family request. Informed consent is given as documented below. Anesthesia: General  The patient does not have sleep apnea, history of MRSA, history of VRE, history of cardiac device requiring special anesthetic needs. Patient is stable and considered clear for surgical in an outpatient ambulatory surgery setting as well as patient hospital setting.  Consent for Operation or Procedure: Provider Certification I hereby certify that the nature, purpose, benefits, usual and most frequent risks of, and alternatives to, the operation or procedure have been explained to the patient (or person authorized to sign for the patient) either by me as responsible physician or by the provider who is to perform the operation or procedure. Time spent such that the patient/family has had an opportunity to ask questions, and that  those questions have been answered. The patient or the patient's representative has been advised that selected tasks may be performed by assistants to the primary health care provider(s). I believe that the patient (or person authorized to sign for the patient) understands what has been explained, and has consented to the operation or procedure. No guarantees were implied or made.   Michaelle Birks 04/11/2022, 12:28 PM

## 2022-04-11 NOTE — Anesthesia Preprocedure Evaluation (Addendum)
Anesthesia Evaluation  Patient identified by MRN, date of birth, ID band Patient awake    Reviewed: Allergy & Precautions, NPO status , Patient's Chart, lab work & pertinent test results  Airway Mallampati: III  TM Distance: >3 FB Neck ROM: Full    Dental no notable dental hx.    Pulmonary neg pulmonary ROS   Pulmonary exam normal breath sounds clear to auscultation       Cardiovascular hypertension (177/71 in preop, doesnt know where her BP normally is), Pt. on medications Normal cardiovascular exam Rhythm:Regular Rate:Normal     Neuro/Psych negative neurological ROS  negative psych ROS   GI/Hepatic Neg liver ROS,GERD  Controlled,,  Endo/Other  diabetes, Well Controlled, Type 2, Oral Hypoglycemic Agents    Renal/GU Renal diseaseCr 1.06, left renal stone   negative genitourinary   Musculoskeletal negative musculoskeletal ROS (+)    Abdominal   Peds  Hematology negative hematology ROS (+)   Anesthesia Other Findings   Reproductive/Obstetrics negative OB ROS                              Anesthesia Physical Anesthesia Plan  ASA: 3  Anesthesia Plan: General   Post-op Pain Management: Tylenol PO (pre-op)*   Induction: Intravenous  PONV Risk Score and Plan: 4 or greater and Ondansetron, Dexamethasone and Treatment may vary due to age or medical condition  Airway Management Planned: LMA  Additional Equipment: None  Intra-op Plan:   Post-operative Plan: Extubation in OR  Informed Consent: I have reviewed the patients History and Physical, chart, labs and discussed the procedure including the risks, benefits and alternatives for the proposed anesthesia with the patient or authorized representative who has indicated his/her understanding and acceptance.     Dental advisory given  Plan Discussed with: CRNA  Anesthesia Plan Comments:         Anesthesia Quick Evaluation

## 2022-04-11 NOTE — ED Notes (Signed)
Urologist at bedside.

## 2022-04-11 NOTE — Anesthesia Procedure Notes (Signed)
Procedure Name: Intubation Date/Time: 04/11/2022 3:15 PM  Performed by: Lind Covert, CRNAPre-anesthesia Checklist: Patient identified, Emergency Drugs available, Suction available, Patient being monitored and Timeout performed Patient Re-evaluated:Patient Re-evaluated prior to induction Oxygen Delivery Method: Circle system utilized Preoxygenation: Pre-oxygenation with 100% oxygen Induction Type: IV induction, Rapid sequence and Cricoid Pressure applied Laryngoscope Size: Mac, 3 and Glidescope Grade View: Grade I Tube type: Oral Tube size: 7.0 mm Number of attempts: 1 Airway Equipment and Method: Stylet Placement Confirmation: ETT inserted through vocal cords under direct vision, positive ETCO2 and breath sounds checked- equal and bilateral Secured at: 22 cm Tube secured with: Tape Dental Injury: Teeth and Oropharynx as per pre-operative assessment

## 2022-04-11 NOTE — ED Notes (Signed)
Report given to [place] at [facility]

## 2022-04-11 NOTE — ED Notes (Signed)
Pt assisted to vehicle. Transported POV with husband

## 2022-04-11 NOTE — Op Note (Signed)
OPERATIVE NOTE   Patient Name: Cheryl Stevens  MRN: [id number]   Date of Procedure: 04/11/22  Preoperative diagnosis:  Left proximal ureteral calculus Left flank pain  Postoperative diagnosis:  Left proximal ureteral calculus Left flank pain Complete left ureteral obstruction  Procedure:  Cystoscopy Bilateral retrograde pyelograms with intraoperative interpretation Insertion of left ureteral stent (6 French x 26 cm, no tether)  Attending: Primus Bravo, MD  Anesthesia: General  Estimated blood loss: <5 mL  Fluids: Per anesthesia record  Drains: 49F x 26 cm left ureteral stent, (no tether)  Specimens: none  Antibiotics: Ancef 2 gm IV  Findings: Normal urethra and bladder; normal right retrograde pyelogram; 9 mm proximal left ureteral calculus with complete obstruction  Indications:  77 year old female who presented to the emergency room last night with a 4-day history of left sided flank pain and associated nausea and vomiting.  Evaluation demonstrated a 9 mm calculus in the proximal left ureter with associated left hydronephrosis.  No other stones were seen.  Her creatinine was normal at 0.88.  This showed red blood cells.  She has had worsening left-sided flank pain which has been difficult to control.  She presented back to the emergency room this morning with worsening flank pain and continued nausea and vomiting.  Treatment options were discussed with the patient.  She presents now for cystoscopy, bilateral retrograde pyelograms, and insertion of a left ureteral stent.  She understands that this is to provide decompression of the left kidney to relieve her symptoms of flank pain and nausea and vomiting and that further treatment will be necessary for the stone.  I discussed treatment with shockwave lithotripsy in the near future.  Risk and benefits of the treatment were discussed in detail.  She understands wishes to proceed as described.  Description of Procedure:   The patient received IV Ancef preoperatively.  After successful induction of a general anesthetic, the patient was placed in the dorsolithotomy position.  The patient's genital area was prepped and draped in sterile fashion.  Under direct visualization, a 54 French rigid cystoscope was passed through the urethra and into the bladder.  No urethral or bladder abnormalities were seen.  A normal-appearing trigone was noted with a single orifice bilaterally.  Bilateral retrograde pyelograms were performed for evaluation of the patient's upper tracts given her CT findings of a ureteral calculus and hydronephrosis.  Scout film showed a nonspecific bowel gas pattern.  No bony abnormalities were seen.  There was contrast filling a dilated left renal pelvis with a point of obstruction at the UPJ/proximal ureter.  The contrast made visualization of a stone difficult.  Using a 5 [place] open-ended catheter, contrast was injected into the right ureter.  A normal-appearing right ureter and collecting system were noted.  No filling defects or evidence of obstruction was seen.  In a similar fashion, contrast was injected into the left ureter.  The left distal and mid ureter were normal without filling defect.  There was a point of obstruction noted in the proximal left ureter with out any significant contrast seen proximal to this other than that noted from the prior CT study.  This was consistent with near complete obstruction of the left kidney.  The open-ended ureteral catheter was passed into the proximal left ureter.  A sensor guidewire was carefully passed through the ureteral catheter and beyond the point of obstruction into the dilated left renal pelvis.  The open-ended ureteral catheter was then passed over the guidewire and into the  renal pelvis.  A hydronephrotic drip was noted after removal of the sensor wire.  Injection of contrast confirmed location within the renal pelvis.  The guidewire was replaced.  A 6  French by 26 cm double-J stent was then passed over the guidewire and into the left renal pelvis.  Position was confirmed with fluoroscopy.  The tether was removed prior to stent placement.  The bladder was then drained and the cystoscope was removed.  The patient was given intraurethral lidocaine jelly.  She was then extubated and taken to the post anesthesia care unit in stable condition.  Complications: None  Condition: Stable, extubated, transferred to PACU  Plan:  Continue left ureteral stent Will arrange for outpatient follow-up to discuss treatment of the left ureteral calculus with shockwave lithotripsy.

## 2022-04-11 NOTE — ED Provider Notes (Signed)
[facility] Provider Note   CSN: [id number] Arrival date & time: 04/11/22  1022     History  Chief Complaint  Patient presents with   Flank Pain    Cheryl Stevens is a 77 y.o. female.  Patient here with ongoing left flank pain.  Diagnosed with kidney stone yesterday.  Denies any fevers but ongoing pain and nausea.  Was post to try to follow-up with urology today for planned but was unable to get a hold of them.  She is returned for evaluation.  The history is provided by the patient.       Home Medications Prior to Admission medications   Medication Sig Start Date End Date Taking? Authorizing Provider  amLODipine (NORVASC) 2.5 MG tablet Take 2.5 mg by mouth daily.    [provider]  anastrozole (ARIMIDEX) 1 MG tablet Take 1 mg by mouth daily. 01/10/20   [provider]  B Complex-C (B-COMPLEX WITH VITAMIN C) tablet Take 1 tablet by mouth daily.    [provider]  Biotin 10 MG TABS Take 10 mg by mouth every morning.    [provider]  gabapentin (NEURONTIN) 300 MG capsule Take 300 mg by mouth 2 (two) times daily. 04/17/15   [provider]  GLIPIZIDE XL 5 MG 24 hr tablet Take 5 mg by mouth daily with breakfast.  04/17/15   [provider]  HYDROmorphone (DILAUDID) 2 MG tablet Take 1 tablet (2 mg total) by mouth every 6 (six) hours as needed for severe pain. 04/10/22   Evlyn Courier, PA-C  Multiple Vitamin (MULTIVITAMIN WITH MINERALS) TABS tablet Take 1 tablet by mouth daily.    [provider]  naproxen (NAPROSYN) 375 MG tablet Take 1 tablet (375 mg total) by mouth 2 (two) times daily. 04/05/22   Sherrill Raring, PA-C  omeprazole (PRILOSEC) 20 MG capsule Take 20 mg by mouth daily as needed (heartburn). 04/17/15   [provider]  ondansetron (ZOFRAN-ODT) 4 MG disintegrating tablet Take 1 tablet (4 mg total) by mouth every 8 (eight) hours as needed. 04/10/22   Deatra Canter, Amjad,  PA-C  rosuvastatin (CRESTOR) 10 MG tablet Take 10 mg by mouth every morning.     [provider]  saccharomyces boulardii (FLORASTOR) 250 MG capsule Take 1 capsule (250 mg total) by mouth 2 (two) times daily. 04/25/15   Elgergawy, Silver Huguenin, MD  tamsulosin (FLOMAX) 0.4 MG CAPS capsule Take 1 capsule (0.4 mg total) by mouth daily. 04/10/22   Evlyn Courier, PA-C      Allergies    Lipitor [atorvastatin] and Metformin and related    Review of Systems   Review of Systems  Physical Exam Updated Vital Signs BP (!) 176/86 (BP Location: Left Arm)   Pulse 78   Temp 98.2 F (36.8 C) (Oral)   Resp 20   Ht 5\' 6"  (1.676 m)   Wt 90.7 kg   SpO2 96%   BMI 32.27 kg/m  Physical Exam Vitals and nursing note reviewed.  Constitutional:      General: She is not in acute distress.    Appearance: She is well-developed.  HENT:     Head: Normocephalic and atraumatic.  Eyes:     Conjunctiva/sclera: Conjunctivae normal.  Cardiovascular:     Rate and Rhythm: Normal rate and regular rhythm.     Heart sounds: No murmur heard. Pulmonary:     Effort: Pulmonary effort is normal. No respiratory distress.  Breath sounds: Normal breath sounds.  Abdominal:     Palpations: Abdomen is soft.     Tenderness: There is no abdominal tenderness.  Musculoskeletal:        General: No swelling.     Cervical back: Neck supple.  Skin:    General: Skin is warm and dry.     Capillary Refill: Capillary refill takes less than 2 seconds.  Neurological:     Mental Status: She is alert.  Psychiatric:        Mood and Affect: Mood normal.     ED Results / Procedures / Treatments   Labs (all labs ordered are listed, but only abnormal results are displayed) Labs Reviewed  CBC WITH DIFFERENTIAL/PLATELET - Abnormal; Notable for the following components:      Result Value   WBC 14.4 (*)    Neutro Abs 10.8 (*)    Monocytes Absolute 1.1 (*)    Abs Immature Granulocytes 0.08 (*)    All other components within  normal limits  BASIC METABOLIC PANEL  URINALYSIS, ROUTINE W REFLEX MICROSCOPIC    EKG None  Radiology CT ABDOMEN PELVIS W CONTRAST  Result Date: 04/10/2022 CLINICAL DATA:  Abdominal pain. EXAM: CT ABDOMEN AND PELVIS WITH CONTRAST TECHNIQUE: Multidetector CT imaging of the abdomen and pelvis was performed using the standard protocol following bolus administration of intravenous contrast. RADIATION DOSE REDUCTION: This exam was performed according to the departmental dose-optimization program which includes automated exposure control, adjustment of the mA and/or kV according to patient size and/or use of iterative reconstruction technique. CONTRAST:  139mL OMNIPAQUE IOHEXOL 300 MG/ML  SOLN COMPARISON:  April 29, 2020 FINDINGS: Lower chest: No acute abnormality. Hepatobiliary: There is diffuse fatty infiltration of the liver parenchyma. A stable 16 mm x 11 mm focus of parenchymal low attenuation is seen within the left lobe. A thin layer of tiny gallstones is seen within the dependent portion of an otherwise normal-appearing gallbladder. Pancreas: Unremarkable. No pancreatic ductal dilatation or surrounding inflammatory changes. Spleen: Normal in size without focal abnormality. Adrenals/Urinary Tract: Adrenal glands are unremarkable. Kidneys are normal in size, without focal lesions. A 9 mm obstructing renal calculus is seen within the proximal left ureter, near the left UPJ. There is mild left-sided hydronephrosis, hydroureter and perinephric inflammatory fat stranding. Bladder is unremarkable. Stomach/Bowel: Stomach is within normal limits. The appendix is not clearly identified. No evidence of bowel wall thickening, distention, or inflammatory changes. Noninflamed diverticula are seen throughout the large bowel. Vascular/Lymphatic: Aortic atherosclerosis. No enlarged abdominal or pelvic lymph nodes. Reproductive: Status post hysterectomy. No adnexal masses. Other: No abdominal wall hernia or abnormality.  No abdominopelvic ascites. Musculoskeletal: Multilevel degenerative changes are seen throughout the lumbar spine. IMPRESSION: 1. 9 mm obstructing renal calculus within the proximal left ureter. 2. Colonic diverticulosis. 3. Hepatic steatosis. 4. Cholelithiasis. 5. Aortic atherosclerosis. Aortic Atherosclerosis (ICD10-I70.0). Electronically Signed   By: Virgina Norfolk M.D.   On: 04/10/2022 17:38    Procedures Procedures    Medications Ordered in ED Medications  fentaNYL (SUBLIMAZE) injection 50 mcg (50 mcg Intravenous Given 04/11/22 1227)  ondansetron (ZOFRAN) injection 4 mg (4 mg Intravenous Given 04/11/22 1226)  sodium chloride 0.9 % bolus 1,000 mL (1,000 mLs Intravenous New Bag/Given 04/11/22 1234)    ED Course/ Medical Decision Making/ A&P                             Medical Decision Making Amount and/or Complexity of Data  Reviewed Labs: ordered.  Risk Prescription drug management.   Alysah Bina is here for reevaluation of kidney stone pain.  Normal vitals.  No fever.  Talked with Dr. Felipa Eth with urology who will come talk with the patient about plan.  She has a 9 mm obstructed left-sided kidney stone in the ureter.  She has been NPO.  She had unremarkable labs yesterday.  Urinalysis was negative for infection.  She continues to be afebrile here.  Will recheck labs and urinalysis and give a round of pain medicine and IV fluids and IV nausea meds and await urology consultation.  Lab works unremarkable.  Dr. Felipa Eth came down to the ED to evaluate the patient and ultimately patient will go POV to [facility] short stay for stent intervention to kidney stone.  Hemodynamically stable throughout my care.  Stable for transfer personal vehicle with family member per patient preference.  This chart was dictated using voice recognition software.  Despite best efforts to proofread,  errors can occur which can change the documentation meaning.         Final Clinical  Impression(s) / ED Diagnoses Final diagnoses:  Kidney stone    Rx / DC Orders ED Discharge Orders     None         Lennice Sites, DO 04/11/22 1251

## 2022-04-11 NOTE — Anesthesia Postprocedure Evaluation (Signed)
Anesthesia Post Note  Patient: Cheryl Stevens  Procedure(s) Performed: CYSTOSCOPY WITH BILATERAL RETROGRADE PYELOGRAM/URETERAL STENT PLACEMENT (Left)     Patient location during evaluation: PACU Anesthesia Type: General Level of consciousness: awake and alert, oriented and patient cooperative Pain management: pain level controlled Vital Signs Assessment: post-procedure vital signs reviewed and stable Respiratory status: spontaneous breathing, nonlabored ventilation and respiratory function stable Cardiovascular status: blood pressure returned to baseline and stable Postop Assessment: no apparent nausea or vomiting Anesthetic complications: no   No notable events documented.  Last Vitals:  Vitals:   04/11/22 1630 04/11/22 1645  BP: (!) 169/85 (!) 165/73  Pulse: 77 79  Resp: 13 14  Temp:  36.5 C  SpO2: 94% 92%    Last Pain:  Vitals:   04/11/22 1615  TempSrc:   PainSc: [place]

## 2022-04-11 NOTE — ED Notes (Signed)
IV site secured. Pt and husband aware to remain NPO. Pt will be transported to [facility]. Instructions and address given to husband. States understanding. Dr Felipa Eth will assume care upon arrival

## 2022-04-11 NOTE — Transfer of Care (Signed)
Immediate Anesthesia Transfer of Care Note  Patient: Cheryl Stevens  Procedure(s) Performed: CYSTOSCOPY WITH BILATERAL RETROGRADE PYELOGRAM/URETERAL STENT PLACEMENT (Left)  Patient Location: PACU  Anesthesia Type:General  Level of Consciousness: sedated  Airway & Oxygen Therapy: Patient Spontanous Breathing and Patient connected to face mask oxygen  Post-op Assessment: Report given to RN and Post -op Vital signs reviewed and stable  Post vital signs: Reviewed and stable  Last Vitals:  Vitals Value Taken Time  BP 149/71 04/11/22 1545  Temp    Pulse 75 04/11/22 1545  Resp 15 04/11/22 1545  SpO2 95 % 04/11/22 1545  Vitals shown include unvalidated device data.  Last Pain:  Vitals:   04/11/22 1417  TempSrc:   PainSc: 5          Complications: No notable events documented.

## 2022-04-11 NOTE — ED Triage Notes (Signed)
Pt states continued left sided flank pain since yetsterday, unable to get in with urology

## 2022-04-12 ENCOUNTER — Encounter (HOSPITAL_COMMUNITY): Payer: Self-pay | Admitting: Urology

## 2022-04-16 ENCOUNTER — Ambulatory Visit (HOSPITAL_BASED_OUTPATIENT_CLINIC_OR_DEPARTMENT_OTHER)
Admission: RE | Admit: 2022-04-16 | Discharge: 2022-04-16 | Disposition: A | Discharge status: Home / Self Care | Payer: Medicare Other | Source: Ambulatory Visit | Attending: Urology | Admitting: Urology

## 2022-04-16 ENCOUNTER — Ambulatory Visit (INDEPENDENT_AMBULATORY_CARE_PROVIDER_SITE_OTHER): Payer: Medicare Other | Admitting: Urology

## 2022-04-16 ENCOUNTER — Encounter: Payer: Self-pay | Admitting: Urology

## 2022-04-16 VITALS — BP 153/86 | HR 76 | Ht 66.0 in | Wt 200.0 lb

## 2022-04-16 DIAGNOSIS — N201 Calculus of ureter: Secondary | ICD-10-CM | POA: Diagnosis not present

## 2022-04-16 LAB — URINALYSIS, ROUTINE W REFLEX MICROSCOPIC
Bilirubin, UA: NEGATIVE
Nitrite, UA: POSITIVE — AB
Specific Gravity, UA: 1.01 (ref 1.005–1.030)
Urobilinogen, Ur: 4 mg/dL — ABNORMAL HIGH (ref 0.2–1.0)
pH, UA: 8.5 — ABNORMAL HIGH (ref 5.0–7.5)

## 2022-04-16 LAB — MICROSCOPIC EXAMINATION
Cast Type: NONE SEEN
Casts: NONE SEEN /lpf
RBC, Urine: >30 /hpf — AB (ref 0–2)
Trichomonas, UA: NONE SEEN
Yeast, UA: NONE SEEN

## 2022-04-16 MED ORDER — TAMSULOSIN HCL 0.4 MG PO CAPS: 0.4000 mg | ORAL_CAPSULE | Freq: Every day | ORAL | 1 refills | Status: AC

## 2022-04-16 MED ORDER — HYDROCODONE-ACETAMINOPHEN 5-325 MG PO TABS: 1.0000 | ORAL_TABLET | Freq: Four times a day (QID) | ORAL | 0 refills | Status: AC | PRN

## 2022-04-16 NOTE — Progress Notes (Signed)
Assessment: 1. Left ureteral calculus     Plan: KUB from today reviewed.  The study shows the left ureteral stent in good position.  There is a 6 x 15 mm calcification at the proximal aspect of the stent consistent with the proximal left ureteral calculus. Resolve Mdx Urine culture sent today. Will call with results. Continue cefdinir. Continue tamsulosin daily and Pyridium as needed. Refill of pain medicine provided. Options for management of the proximal left ureteral calculus discussed including shockwave lithotripsy and ureteroscopic laser lithotripsy.  Risk and benefits of each treatment modality reviewed.  She would like to proceed with shockwave lithotripsy.  Procedure: The patient will be scheduled for left ESL at [facility].  Surgical request is placed with the surgery schedulers and will be scheduled at the patient's/family request. Informed consent is given as documented below. Anesthesia:  local  The patient does not have sleep apnea, history of MRSA, history of VRE, history of cardiac device requiring special anesthetic needs. Patient is stable and considered clear for surgical in an outpatient ambulatory surgery setting as well as patient hospital setting.  Consent for Operation or Procedure: Provider Certification I hereby certify that the nature, purpose, benefits, usual and most frequent risks of, and alternatives to, the operation or procedure have been explained to the patient (or person authorized to sign for the patient) either by me as responsible physician or by the provider who is to perform the operation or procedure. Time spent such that the patient/family has had an opportunity to ask questions, and that those questions have been answered. The patient or the patient's representative has been advised that selected tasks may be performed by assistants to the primary health care provider(s). I believe that the patient (or person authorized to sign for the patient)  understands what has been explained, and has consented to the operation or procedure. No guarantees were implied or made.   Chief Complaint: Chief Complaint  Patient presents with   Nephrolithiasis    HPI: Cheryl Stevens is a 77 y.o. female who presents for continued evaluation of a left ureteral calculus and flank pain. She presented to the emergency room on 04/10/22 with a 4-day history of left sided flank pain and associated nausea and vomiting. Evaluation demonstrated a 9 mm calculus in the proximal left ureter with associated left hydronephrosis. No other stones were seen. Her creatinine was normal at 0.88. U/A showed red blood cells. She had worsening left-sided flank pain which was difficult to control. She presented back to the emergency room on 04/11/22 with worsening flank pain and continued nausea and vomiting. Treatment options were discussed with the patient. She underwent cystoscopy,bilateral retrograde pyelograms, and insertion of left ureteral stent on 04/11/22.  She returns today for follow-up.  She continues on cefdinir.  She is having some low back pain.  No flank or abdominal pain.  She reports urinary frequency, urgency, and nocturia.  No fevers or chills.  No nausea or vomiting.   Portions of the above documentation were copied from a prior visit for review purposes only.  Allergies: Allergies  Allergen Reactions   Lipitor [Atorvastatin] Other (See Comments)    Muscle cramps   Metformin And Related Other (See Comments)    Leg pain    PMH: Past Medical History:  Diagnosis Date   Cancer ([place])    Diabetes mellitus without complication ([place])    Essential hypertension 04/29/2020   High cholesterol    Hypertension    Prediabetes  PSH: Past Surgical History:  Procedure Laterality Date   ABDOMINAL HYSTERECTOMY     CYSTOSCOPY W/ URETERAL STENT PLACEMENT Left 04/11/2022   Procedure: CYSTOSCOPY WITH BILATERAL RETROGRADE PYELOGRAM/URETERAL STENT PLACEMENT;   Surgeon: Primus Bravo., MD;  Location: [facility] ORS;  Service: Urology;  Laterality: Left;    SH: Social History   Tobacco Use   Smoking status: Never   Smokeless tobacco: Never  Substance Use Topics   Alcohol use: Yes    Comment: social   Drug use: Never    ROS: Constitutional:  Negative for fever, chills, weight loss CV: Negative for chest pain, previous MI, hypertension Respiratory:  Negative for shortness of breath, wheezing, sleep apnea, frequent cough GI:  Negative for nausea, vomiting, bloody stool, GERD  PE: BP (!) 153/86   Pulse 76   Ht 5\' 6"  (1.676 m)   Wt 200 lb (90.7 kg)   BMI 32.28 kg/m  GENERAL APPEARANCE:  Well appearing, well developed, well nourished, NAD HEENT:  Atraumatic, normocephalic, oropharynx clear NECK:  Supple without lymphadenopathy or thyromegaly ABDOMEN:  Soft, non-tender, no masses EXTREMITIES:  Moves all extremities well, without clubbing, cyanosis, or edema NEUROLOGIC:  Alert and oriented x 3, normal gait, CN II-XII grossly intact MENTAL STATUS:  appropriate BACK:  Non-tender to palpation, No CVAT SKIN:  Warm, dry, and intact   Results: U/A:  11-30 WBC, >30 RBC, >10 epis, mod bacteria, nitrite +

## 2022-04-17 ENCOUNTER — Ambulatory Visit: Payer: Self-pay | Admitting: Urology

## 2022-04-17 DIAGNOSIS — N201 Calculus of ureter: Secondary | ICD-10-CM

## 2022-04-18 NOTE — Progress Notes (Signed)
Talked with patient. Instructions given. Arrival time 1100. Nothing to eat or drink after 6 am Monday. Husband is the driver. To bring in blue folder. Hx and meds reviewed.

## 2022-04-19 ENCOUNTER — Telehealth: Payer: Self-pay

## 2022-04-19 ENCOUNTER — Other Ambulatory Visit: Payer: Self-pay | Admitting: Urology

## 2022-04-19 ENCOUNTER — Encounter: Payer: Self-pay | Admitting: Urology

## 2022-04-19 DIAGNOSIS — R8290 Unspecified abnormal findings in urine: Secondary | ICD-10-CM

## 2022-04-19 MED ORDER — LEVOFLOXACIN 500 MG PO TABS: 500.0000 mg | ORAL_TABLET | Freq: Every day | ORAL | 0 refills | Status: AC

## 2022-04-19 NOTE — Telephone Encounter (Signed)
-----   Message from Primus Bravo, MD sent at 04/19/2022 11:22 AM EDT ----- Please notify patient to begin levaquin 500 mg daily for 7 days for treatment of UTI.  Rx sent to pharmacy.

## 2022-04-19 NOTE — Telephone Encounter (Signed)
Pt notified, expressed understanding.   

## 2022-04-19 NOTE — Telephone Encounter (Signed)
error 

## 2022-04-23 ENCOUNTER — Ambulatory Visit (HOSPITAL_BASED_OUTPATIENT_CLINIC_OR_DEPARTMENT_OTHER)
Admission: RE | Admit: 2022-04-23 | Discharge: 2022-04-23 | Disposition: A | Discharge status: Home / Self Care | Payer: Medicare Other | Attending: Urology | Admitting: Urology

## 2022-04-23 ENCOUNTER — Encounter (HOSPITAL_BASED_OUTPATIENT_CLINIC_OR_DEPARTMENT_OTHER)
Admission: RE | Disposition: A | Discharge status: Home / Self Care | Payer: Self-pay | Source: Home / Self Care | Attending: Urology

## 2022-04-23 ENCOUNTER — Ambulatory Visit (HOSPITAL_COMMUNITY): Payer: Medicare Other

## 2022-04-23 ENCOUNTER — Encounter (HOSPITAL_BASED_OUTPATIENT_CLINIC_OR_DEPARTMENT_OTHER): Payer: Self-pay | Admitting: Urology

## 2022-04-23 ENCOUNTER — Other Ambulatory Visit: Payer: Self-pay | Admitting: Urology

## 2022-04-23 ENCOUNTER — Other Ambulatory Visit: Payer: Self-pay

## 2022-04-23 DIAGNOSIS — E119 Type 2 diabetes mellitus without complications: Secondary | ICD-10-CM | POA: Insufficient documentation

## 2022-04-23 DIAGNOSIS — I10 Essential (primary) hypertension: Secondary | ICD-10-CM | POA: Insufficient documentation

## 2022-04-23 DIAGNOSIS — N201 Calculus of ureter: Secondary | ICD-10-CM | POA: Diagnosis not present

## 2022-04-23 DIAGNOSIS — E669 Obesity, unspecified: Secondary | ICD-10-CM | POA: Diagnosis not present

## 2022-04-23 DIAGNOSIS — Z6832 Body mass index (BMI) 32.0-32.9, adult: Secondary | ICD-10-CM | POA: Diagnosis not present

## 2022-04-23 DIAGNOSIS — Z853 Personal history of malignant neoplasm of breast: Secondary | ICD-10-CM | POA: Diagnosis not present

## 2022-04-23 DIAGNOSIS — Z01818 Encounter for other preprocedural examination: Secondary | ICD-10-CM

## 2022-04-23 HISTORY — PX: EXTRACORPOREAL SHOCK WAVE LITHOTRIPSY: SHX1557

## 2022-04-23 LAB — GLUCOSE, CAPILLARY: Glucose-Capillary: 135 mg/dL — ABNORMAL HIGH (ref 70–99)

## 2022-04-23 SURGERY — LITHOTRIPSY, ESWL
Anesthesia: LOCAL | Laterality: Left

## 2022-04-23 MED ORDER — DIPHENHYDRAMINE HCL 25 MG PO CAPS
25.0000 mg | ORAL_CAPSULE | ORAL | Status: AC
  Administered 2022-04-23: 25 mg via ORAL

## 2022-04-23 MED ORDER — ONDANSETRON 4 MG PO TBDP: 4.0000 mg | ORAL_TABLET | Freq: Three times a day (TID) | ORAL | 0 refills | Status: AC | PRN

## 2022-04-23 MED ORDER — SODIUM CHLORIDE 0.9 % IV SOLN: INTRAVENOUS | Status: DC

## 2022-04-23 MED ORDER — DIAZEPAM 5 MG PO TABS
ORAL_TABLET | ORAL | Status: AC
  Filled 2022-04-23: qty 2

## 2022-04-23 MED ORDER — CIPROFLOXACIN HCL 500 MG PO TABS
ORAL_TABLET | ORAL | Status: AC
  Filled 2022-04-23: qty 1

## 2022-04-23 MED ORDER — CIPROFLOXACIN HCL 500 MG PO TABS
500.0000 mg | ORAL_TABLET | ORAL | Status: AC
  Administered 2022-04-23: 500 mg via ORAL

## 2022-04-23 MED ORDER — DIPHENHYDRAMINE HCL 25 MG PO CAPS
ORAL_CAPSULE | ORAL | Status: AC
  Filled 2022-04-23: qty 1

## 2022-04-23 MED ORDER — DIAZEPAM 5 MG PO TABS
10.0000 mg | ORAL_TABLET | ORAL | Status: AC
  Administered 2022-04-23: 10 mg via ORAL

## 2022-04-23 NOTE — H&P (Addendum)
UROLOGY H&P  Assessment: 1. Left ureteral calculus       Plan: KUB from today reviewed.  The study shows the left ureteral stent in good position.  There is a 6 x 15 mm calcification at the proximal aspect of the stent consistent with the proximal left ureteral calculus. She is on appropriate antibiotic coverage for her recent uti. Continue tamsulosin daily and Pyridium as needed.  Options for management of the proximal left ureteral calculus discussed including shockwave lithotripsy and ureteroscopic laser lithotripsy.  Risk and benefits of each treatment modality reviewed.  She would like to proceed with shockwave lithotripsy.   Procedure: The patient will be scheduled for left ESL at [facility].  Surgical request is placed with the surgery schedulers and will be scheduled at the patient's/family request. Informed consent is given as documented below. Anesthesia:  local   The patient does not have sleep apnea, history of MRSA, history of VRE, history of cardiac device requiring special anesthetic needs. Patient is stable and considered clear for surgical in an outpatient ambulatory surgery setting as well as patient hospital setting.   Consent for Operation or Procedure: Provider Certification I hereby certify that the nature, purpose, benefits, usual and most frequent risks of, and alternatives to, the operation or procedure have been explained to the patient (or person authorized to sign for the patient) either by me as responsible physician or by the provider who is to perform the operation or procedure. Time spent such that the patient/family has had an opportunity to ask questions, and that those questions have been answered. The patient or the patient's representative has been advised that selected tasks may be performed by assistants to the primary health care provider(s). I believe that the patient (or person authorized to sign for the patient) understands what has been explained, and  has consented to the operation or procedure. No guarantees were implied or made.     Chief Complaint:    Chief Complaint  Patient presents with   Nephrolithiasis      HPI: Cheryl Stevens is a 77 y.o. female who presents for continued evaluation of a left ureteral calculus and flank pain. She presented to the emergency room on 04/10/22 with a 4-day history of left sided flank pain and associated nausea and vomiting. Evaluation demonstrated a 9 mm calculus in the proximal left ureter with associated left hydronephrosis. No other stones were seen. Her creatinine was normal at 0.88. U/A showed red blood cells. She had worsening left-sided flank pain which was difficult to control. She presented back to the emergency room on 04/11/22 with worsening flank pain and continued nausea and vomiting. Treatment options were discussed with the patient. She underwent cystoscopy,bilateral retrograde pyelograms, and insertion of left ureteral stent on 04/11/22.   She returns today for follow-up.  She continues on cefdinir.  She is having some low back pain.  No flank or abdominal pain.  She reports urinary frequency, urgency, and nocturia.  No fevers or chills.  No nausea or vomiting.     Portions of the above documentation were copied from a prior visit for review purposes only.   Allergies:      Allergies  Allergen Reactions   Lipitor [Atorvastatin] Other (See Comments)      Muscle cramps   Metformin And Related Other (See Comments)      Leg pain      PMH:     Past Medical History:  Diagnosis Date   Cancer ([place])  Diabetes mellitus without complication ([place])     Essential hypertension 04/29/2020   High cholesterol     Hypertension     Prediabetes        PSH:      Past Surgical History:  Procedure Laterality Date   ABDOMINAL HYSTERECTOMY       CYSTOSCOPY W/ URETERAL STENT PLACEMENT Left 04/11/2022    Procedure: CYSTOSCOPY WITH BILATERAL RETROGRADE PYELOGRAM/URETERAL STENT PLACEMENT;   Surgeon: Primus Bravo., MD;  Location: [facility];  Service: Urology;  Laterality: Left;      SH: Social History         Tobacco Use   Smoking status: Never   Smokeless tobacco: Never  Substance Use Topics   Alcohol use: Yes      Comment: social   Drug use: Never      ROS: Constitutional:  Negative for fever, chills, weight loss CV: Negative for chest pain, previous MI, hypertension Respiratory:  Negative for shortness of breath, wheezing, sleep apnea, frequent cough GI:  Negative for nausea, vomiting, bloody stool, GERD   PE: BP (!) 153/86   Pulse 76   Ht 5\' 6"  (1.676 m)   Wt 200 lb (90.7 kg)   BMI 32.28 kg/m  GENERAL APPEARANCE:  Well appearing, well developed, well nourished, NAD HEENT:  Atraumatic, normocephalic, oropharynx clear NECK:  Supple without lymphadenopathy or thyromegaly ABDOMEN:  Soft, non-tender, no masses EXTREMITIES:  Moves all extremities well, without clubbing, cyanosis, or edema NEUROLOGIC:  Alert and oriented x 3, normal gait, CN II-XII grossly intact MENTAL STATUS:  appropriate BACK:  Non-tender to palpation, No CVAT SKIN:  Warm, dry, and intact     Results: Recent urine culture growing Pseudomonas now on appropriate antibiotics.

## 2022-04-24 ENCOUNTER — Encounter (HOSPITAL_BASED_OUTPATIENT_CLINIC_OR_DEPARTMENT_OTHER): Payer: Self-pay | Admitting: Urology

## 2022-04-30 ENCOUNTER — Ambulatory Visit (INDEPENDENT_AMBULATORY_CARE_PROVIDER_SITE_OTHER): Payer: Medicare Other | Admitting: Urology

## 2022-04-30 ENCOUNTER — Ambulatory Visit (HOSPITAL_BASED_OUTPATIENT_CLINIC_OR_DEPARTMENT_OTHER)
Admission: RE | Admit: 2022-04-30 | Discharge: 2022-04-30 | Disposition: A | Discharge status: Home / Self Care | Payer: Medicare Other | Source: Ambulatory Visit | Attending: Urology | Admitting: Urology

## 2022-04-30 VITALS — BP 146/72 | HR 69 | Ht 66.0 in | Wt 205.0 lb

## 2022-04-30 DIAGNOSIS — Z466 Encounter for fitting and adjustment of urinary device: Secondary | ICD-10-CM | POA: Diagnosis not present

## 2022-04-30 DIAGNOSIS — N201 Calculus of ureter: Secondary | ICD-10-CM

## 2022-04-30 LAB — MICROSCOPIC EXAMINATION
Cast Type: NONE SEEN
Casts: NONE SEEN /lpf
Crystal Type: NONE SEEN
Crystals: NONE SEEN
RBC, Urine: >30 /hpf — AB (ref 0–2)
Trichomonas, UA: NONE SEEN
Yeast, UA: NONE SEEN

## 2022-04-30 LAB — URINALYSIS, ROUTINE W REFLEX MICROSCOPIC
Bilirubin, UA: NEGATIVE
Glucose, UA: NEGATIVE
Ketones, UA: NEGATIVE
Nitrite, UA: NEGATIVE
Specific Gravity, UA: 1.02 (ref 1.005–1.030)
Urobilinogen, Ur: 0.2 mg/dL (ref 0.2–1.0)
pH, UA: 7 (ref 5.0–7.5)

## 2022-04-30 MED ORDER — CIPROFLOXACIN HCL 500 MG PO TABS
500.0000 mg | ORAL_TABLET | Freq: Once | ORAL | Status: AC
  Administered 2022-04-30: 500 mg via ORAL

## 2022-04-30 NOTE — Progress Notes (Signed)
Assessment: 1. Left ureteral calculus     Plan: KUB from today reviewed.  The left ureteral stent is in good position.  Some small calcifications are noted along the course of the stent. Left ureteral stent removed today Cipro x 1 following stent removal Return to office in 2 weeks with KUB  Chief Complaint: Chief Complaint  Patient presents with   Cysto Stent Removal    HPI: Cheryl Stevens is a 77 y.o. female who presents for continued evaluation of a left ureteral calculus and flank pain. She presented to the emergency room on 04/10/22 with a 4-day history of left sided flank pain and associated nausea and vomiting. Evaluation demonstrated a 9 mm calculus in the proximal left ureter with associated left hydronephrosis. No other stones were seen. Her creatinine was normal at 0.88. U/A showed red blood cells. She had worsening left-sided flank pain which was difficult to control. She presented back to the emergency room on 04/11/22 with worsening flank pain and continued nausea and vomiting. Treatment options were discussed with the patient. She underwent cystoscopy,bilateral retrograde pyelograms, and insertion of left ureteral stent on 04/11/22. She is s/p left ESL on 04/23/22.   She return today for follow-up.  She has passed stone fragments following lithotripsy.  She is having some stent related symptoms.  No fevers or chills.  No flank pain.  She presents today for cystoscopy and stent removal.   Portions of the above documentation were copied from a prior visit for review purposes only.  Allergies: Allergies  Allergen Reactions   Lipitor [Atorvastatin] Other (See Comments)    Muscle cramps   Metformin And Related Other (See Comments)    Leg pain    PMH: Past Medical History:  Diagnosis Date   Cancer    R Breast, lymph nodes taken   Diabetes mellitus without complication    Essential hypertension 04/29/2020   High cholesterol    Hypertension    Prediabetes      PSH: Past Surgical History:  Procedure Laterality Date   ABDOMINAL HYSTERECTOMY     appendectomy as well   BREAST SURGERY Right    Lumpectomy with lymph nodes   CYSTOSCOPY W/ URETERAL STENT PLACEMENT Left 04/11/2022   Procedure: CYSTOSCOPY WITH BILATERAL RETROGRADE PYELOGRAM/URETERAL STENT PLACEMENT;  Surgeon: Milderd Meager., MD;  Location: [facility];  Service: Urology;  Laterality: Left;   EXTRACORPOREAL SHOCK WAVE LITHOTRIPSY Left 04/23/2022   Procedure: EXTRACORPOREAL SHOCK WAVE LITHOTRIPSY (ESWL);  Surgeon: Joline Maxcy, MD;  Location: [facility];  Service: Urology;  Laterality: Left;    SH: Social History   Tobacco Use   Smoking status: Never   Smokeless tobacco: Never  Substance Use Topics   Alcohol use: Yes    Comment: social   Drug use: Never    ROS: Constitutional:  Negative for fever, chills, weight loss CV: Negative for chest pain, previous MI, hypertension Respiratory:  Negative for shortness of breath, wheezing, sleep apnea, frequent cough GI:  Negative for nausea, vomiting, bloody stool, GERD  PE: BP (!) 146/72   Pulse 69   Ht 5\' 6"  (1.676 m)   Wt 205 lb (93 kg)   BMI 33.09 kg/m  GENERAL APPEARANCE:  Well appearing, well developed, well nourished, NAD HEENT:  Atraumatic, normocephalic, oropharynx clear NECK:  Supple without lymphadenopathy or thyromegaly ABDOMEN:  Soft, non-tender, no masses EXTREMITIES:  Moves all extremities well, without clubbing, cyanosis, or edema NEUROLOGIC:  Alert and oriented x 3, normal gait, CN  II-XII grossly intact MENTAL STATUS:  appropriate BACK:  Non-tender to palpation, No CVAT SKIN:  Warm, dry, and intact   Results: U/A: 0-5 WBC, >30 RBC, few bacteria  CYSTOSCOPY/STENT REMOVAL  Pre-Operative Diagnosis:   ureteral calculus  Post-Operative Diagnosis:  ureteral calculus  Anesthesia: local with lidocaine gel  Surgical Narrative:  After appropriate informed consent was obtained, the  patient was prepped and draped in the usual sterile fashion in the supine position. She was correctly identified and the proper procedure delineated prior to proceeding. Sterile lidocaine gel was instilled in the urethra.  The flexible cystoscope was introduced without difficulty.  The left ureteral stent was identified and removed using stent graspers. She tolerated the procedure well.  A chaperone was present throughout the procedure.

## 2022-05-15 ENCOUNTER — Encounter: Payer: Self-pay | Admitting: Urology

## 2022-05-15 ENCOUNTER — Ambulatory Visit (INDEPENDENT_AMBULATORY_CARE_PROVIDER_SITE_OTHER): Payer: Medicare Other | Admitting: Urology

## 2022-05-15 ENCOUNTER — Ambulatory Visit (HOSPITAL_BASED_OUTPATIENT_CLINIC_OR_DEPARTMENT_OTHER)
Admission: RE | Admit: 2022-05-15 | Discharge: 2022-05-15 | Disposition: A | Discharge status: Home / Self Care | Payer: Medicare Other | Source: Ambulatory Visit | Attending: Urology | Admitting: Urology

## 2022-05-15 VITALS — BP 152/83 | HR 66 | Ht 66.0 in | Wt 200.0 lb

## 2022-05-15 DIAGNOSIS — N201 Calculus of ureter: Secondary | ICD-10-CM

## 2022-05-15 DIAGNOSIS — Z87442 Personal history of urinary calculi: Secondary | ICD-10-CM

## 2022-05-15 DIAGNOSIS — Z09 Encounter for follow-up examination after completed treatment for conditions other than malignant neoplasm: Secondary | ICD-10-CM

## 2022-05-15 LAB — URINALYSIS, ROUTINE W REFLEX MICROSCOPIC
Bilirubin, UA: NEGATIVE
Glucose, UA: NEGATIVE
Ketones, UA: NEGATIVE
Leukocytes,UA: NEGATIVE
Nitrite, UA: NEGATIVE
Protein,UA: NEGATIVE
RBC, UA: NEGATIVE
Specific Gravity, UA: 1.02 (ref 1.005–1.030)
Urobilinogen, Ur: 0.2 mg/dL (ref 0.2–1.0)
pH, UA: 7 (ref 5.0–7.5)

## 2022-05-15 NOTE — Progress Notes (Signed)
Assessment: 1. Left ureteral calculus     Plan: I reviewed the KUB from today.  No obvious calcifications seen in the area of the left renal shadow or along the expected course of the left ureter. Stone fragments sent for analysis. Stone prevention discussed and information provided. Renal ultrasound in 6 weeks - will call with results Return to office prn  Chief Complaint: Chief Complaint  Patient presents with   Nephrolithiasis    HPI: Cheryl Stevens is a 77 y.o. female who presents for continued evaluation of a left ureteral calculus and flank pain. She presented to the emergency room on 04/10/22 with a 4-day history of left sided flank pain and associated nausea and vomiting. Evaluation demonstrated a 9 mm calculus in the proximal left ureter with associated left hydronephrosis. No other stones were seen. Her creatinine was normal at 0.88. U/A showed red blood cells. She had worsening left-sided flank pain which was difficult to control. She presented back to the emergency room on 04/11/22 with worsening flank pain and continued nausea and vomiting. Treatment options were discussed with the patient. She underwent cystoscopy,bilateral retrograde pyelograms, and insertion of left ureteral stent on 04/11/22. She is s/p left ESL on 04/23/22.  She passed stone fragments following lithotripsy.  Her left ureteral stent was removed on 04/30/2022.  She has done well since the stent removal.  She is not aware of passing any fragments but did not strain her urine.  No flank pain.  No dysuria or gross hematuria.  Portions of the above documentation were copied from a prior visit for review purposes only.  Allergies: Allergies  Allergen Reactions   Lipitor [Atorvastatin] Other (See Comments)    Muscle cramps   Metformin And Related Other (See Comments)    Leg pain    PMH: Past Medical History:  Diagnosis Date   Cancer    R Breast, lymph nodes taken   Diabetes mellitus without  complication    Essential hypertension 04/29/2020   High cholesterol    Hypertension    Prediabetes     PSH: Past Surgical History:  Procedure Laterality Date   ABDOMINAL HYSTERECTOMY     appendectomy as well   BREAST SURGERY Right    Lumpectomy with lymph nodes   CYSTOSCOPY W/ URETERAL STENT PLACEMENT Left 04/11/2022   Procedure: CYSTOSCOPY WITH BILATERAL RETROGRADE PYELOGRAM/URETERAL STENT PLACEMENT;  Surgeon: Milderd Meager., MD;  Location: [facility];  Service: Urology;  Laterality: Left;   EXTRACORPOREAL SHOCK WAVE LITHOTRIPSY Left 04/23/2022   Procedure: EXTRACORPOREAL SHOCK WAVE LITHOTRIPSY (ESWL);  Surgeon: Joline Maxcy, MD;  Location: [facility];  Service: Urology;  Laterality: Left;    SH: Social History   Tobacco Use   Smoking status: Never   Smokeless tobacco: Never  Substance Use Topics   Alcohol use: Yes    Comment: social   Drug use: Never    ROS: Constitutional:  Negative for fever, chills, weight loss CV: Negative for chest pain, previous MI, hypertension Respiratory:  Negative for shortness of breath, wheezing, sleep apnea, frequent cough GI:  Negative for nausea, vomiting, bloody stool, GERD  PE: BP (!) 152/83   Pulse 66   Ht  (1.676 m)   Wt 200 lb (90.7 kg)   BMI 32.28 kg/m  GENERAL APPEARANCE:  Well appearing, well developed, well nourished, NAD HEENT:  Atraumatic, normocephalic, oropharynx clear NECK:  Supple without lymphadenopathy or thyromegaly ABDOMEN:  Soft, non-tender, no masses EXTREMITIES:  Moves all extremities well,  without clubbing, cyanosis, or edema NEUROLOGIC:  Alert and oriented x 3, normal gait, CN II-XII grossly intact MENTAL STATUS:  appropriate BACK:  Non-tender to palpation, No CVAT SKIN:  Warm, dry, and intact   Results: U/A: negative

## 2022-05-18 ENCOUNTER — Encounter: Payer: Self-pay | Admitting: Urology

## 2022-05-22 ENCOUNTER — Encounter: Payer: Self-pay | Admitting: Urology

## 2022-06-13 ENCOUNTER — Ambulatory Visit (HOSPITAL_BASED_OUTPATIENT_CLINIC_OR_DEPARTMENT_OTHER)
Admission: RE | Admit: 2022-06-13 | Discharge: 2022-06-13 | Disposition: A | Discharge status: Home / Self Care | Payer: Medicare Other | Source: Ambulatory Visit | Attending: Urology | Admitting: Urology

## 2022-06-13 DIAGNOSIS — N201 Calculus of ureter: Secondary | ICD-10-CM | POA: Insufficient documentation

## 2023-01-11 ENCOUNTER — Encounter (HOSPITAL_BASED_OUTPATIENT_CLINIC_OR_DEPARTMENT_OTHER): Payer: Self-pay | Admitting: Specialist

## 2023-01-11 ENCOUNTER — Other Ambulatory Visit (HOSPITAL_BASED_OUTPATIENT_CLINIC_OR_DEPARTMENT_OTHER): Payer: Self-pay | Admitting: Specialist

## 2023-01-11 DIAGNOSIS — M5416 Radiculopathy, lumbar region: Secondary | ICD-10-CM

## 2023-01-27 ENCOUNTER — Ambulatory Visit (HOSPITAL_BASED_OUTPATIENT_CLINIC_OR_DEPARTMENT_OTHER)
Admission: RE | Admit: 2023-01-27 | Discharge: 2023-01-27 | Disposition: A | Discharge status: Home / Self Care | Payer: Medicare Other | Source: Ambulatory Visit | Attending: Specialist | Admitting: Specialist

## 2023-01-27 DIAGNOSIS — M5416 Radiculopathy, lumbar region: Secondary | ICD-10-CM | POA: Diagnosis present

## 2023-02-09 IMAGING — DX DG ABD PORTABLE 1V
1 series · 1 of 1 positions shown · non-contrast
Comparison: CT 04/29/2020, radiograph 02/17/2020

CLINICAL DATA: NG tube insertion

EXAM:
PORTABLE ABDOMEN - 1 VIEW

[abdomen kub]
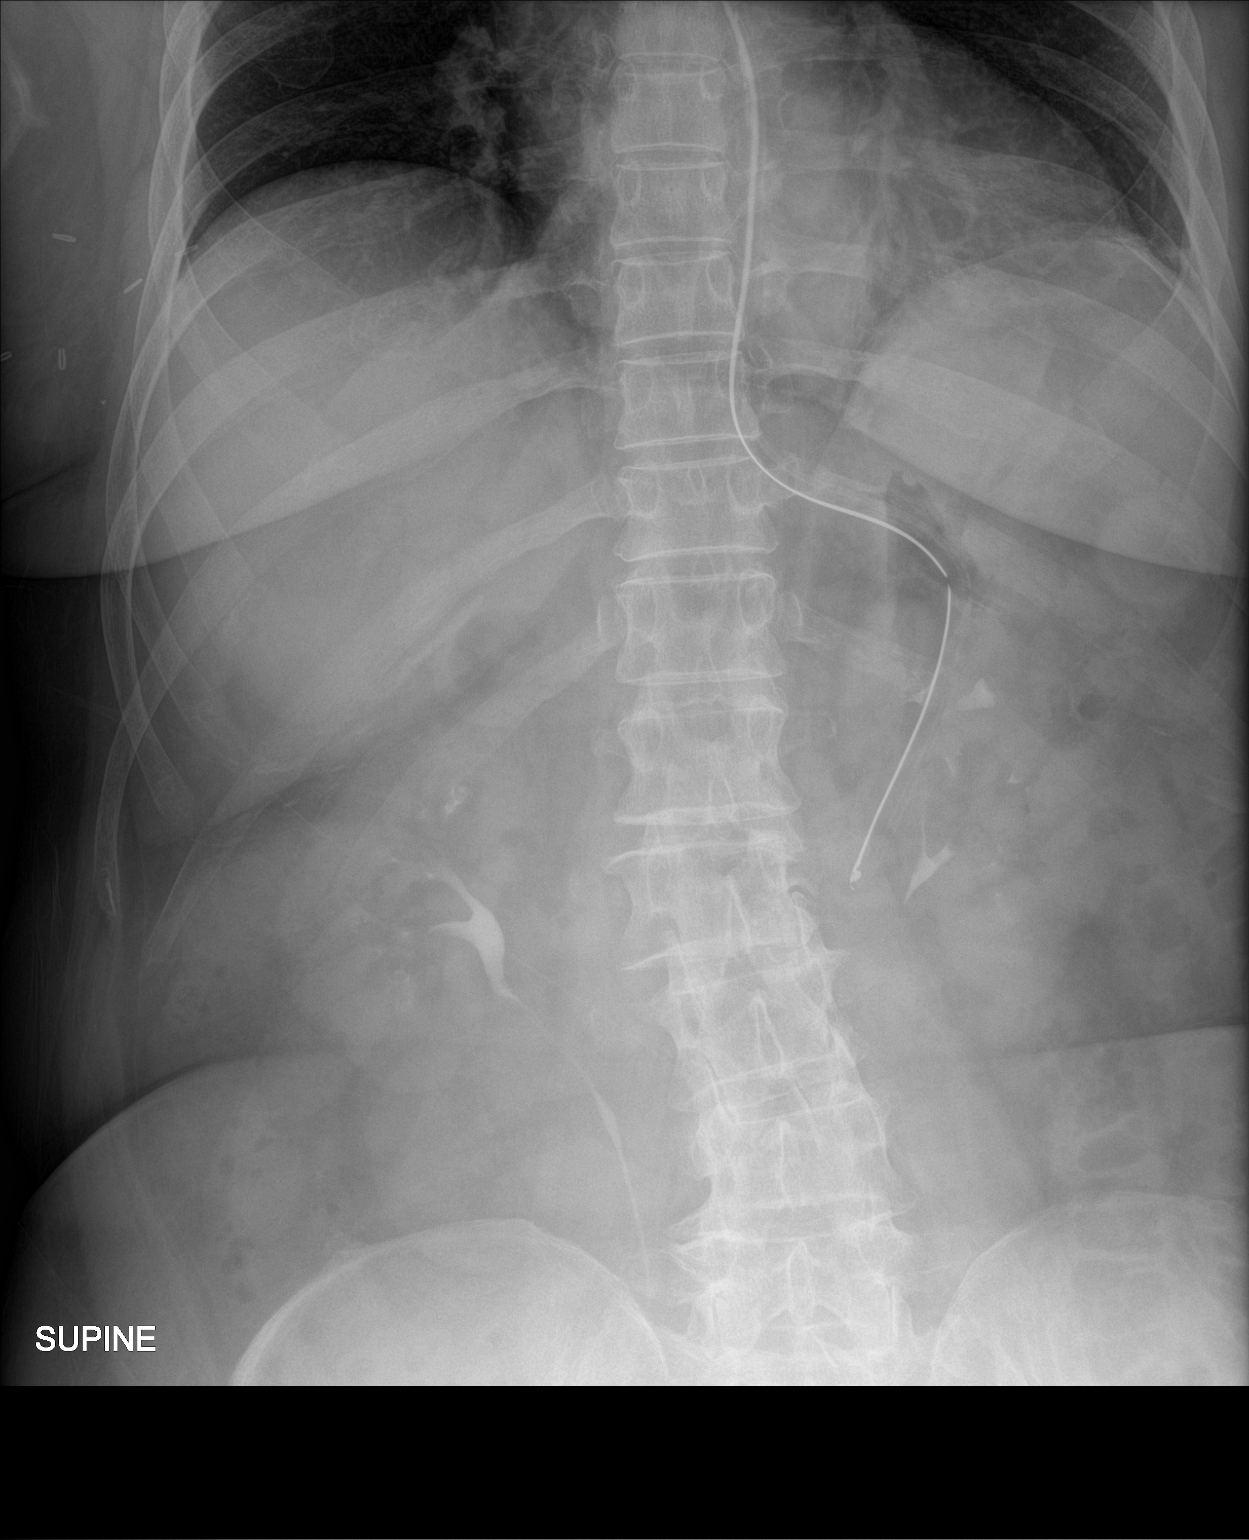

[1 of 1 positions shown; findings below may reference images not displayed]

FINDINGS: Esophageal tube tip and side port overlie the proximal to mid
stomach. Excreted contrast within the renal collecting systems.
IMPRESSION: Esophageal tube tip overlies the mid stomach

## 2023-02-10 IMAGING — DX DG ABD PORTABLE 1V
2 series · 2 of 2 positions shown · non-contrast
Comparison: 04/30/2020, 04/29/2020

CLINICAL DATA: 8 hour delay bowel obstruction

EXAM:
PORTABLE ABDOMEN - 1 VIEW

[abdomen kub (1 of 2)]
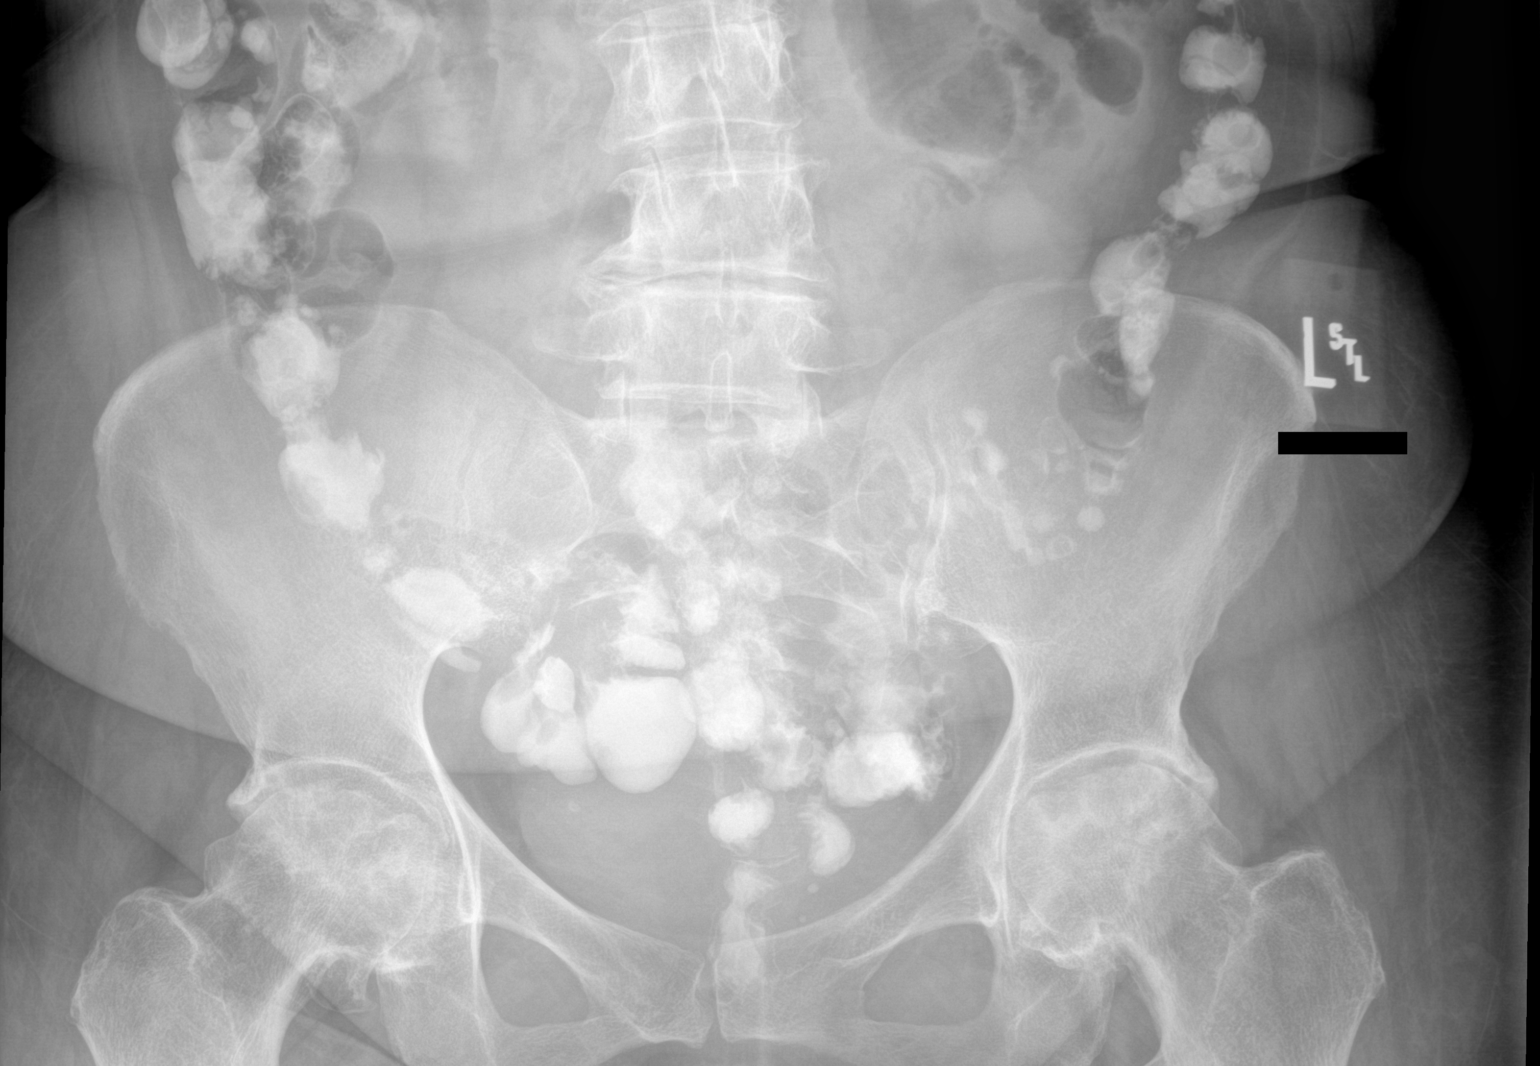

[abdomen kub (2 of 2)]
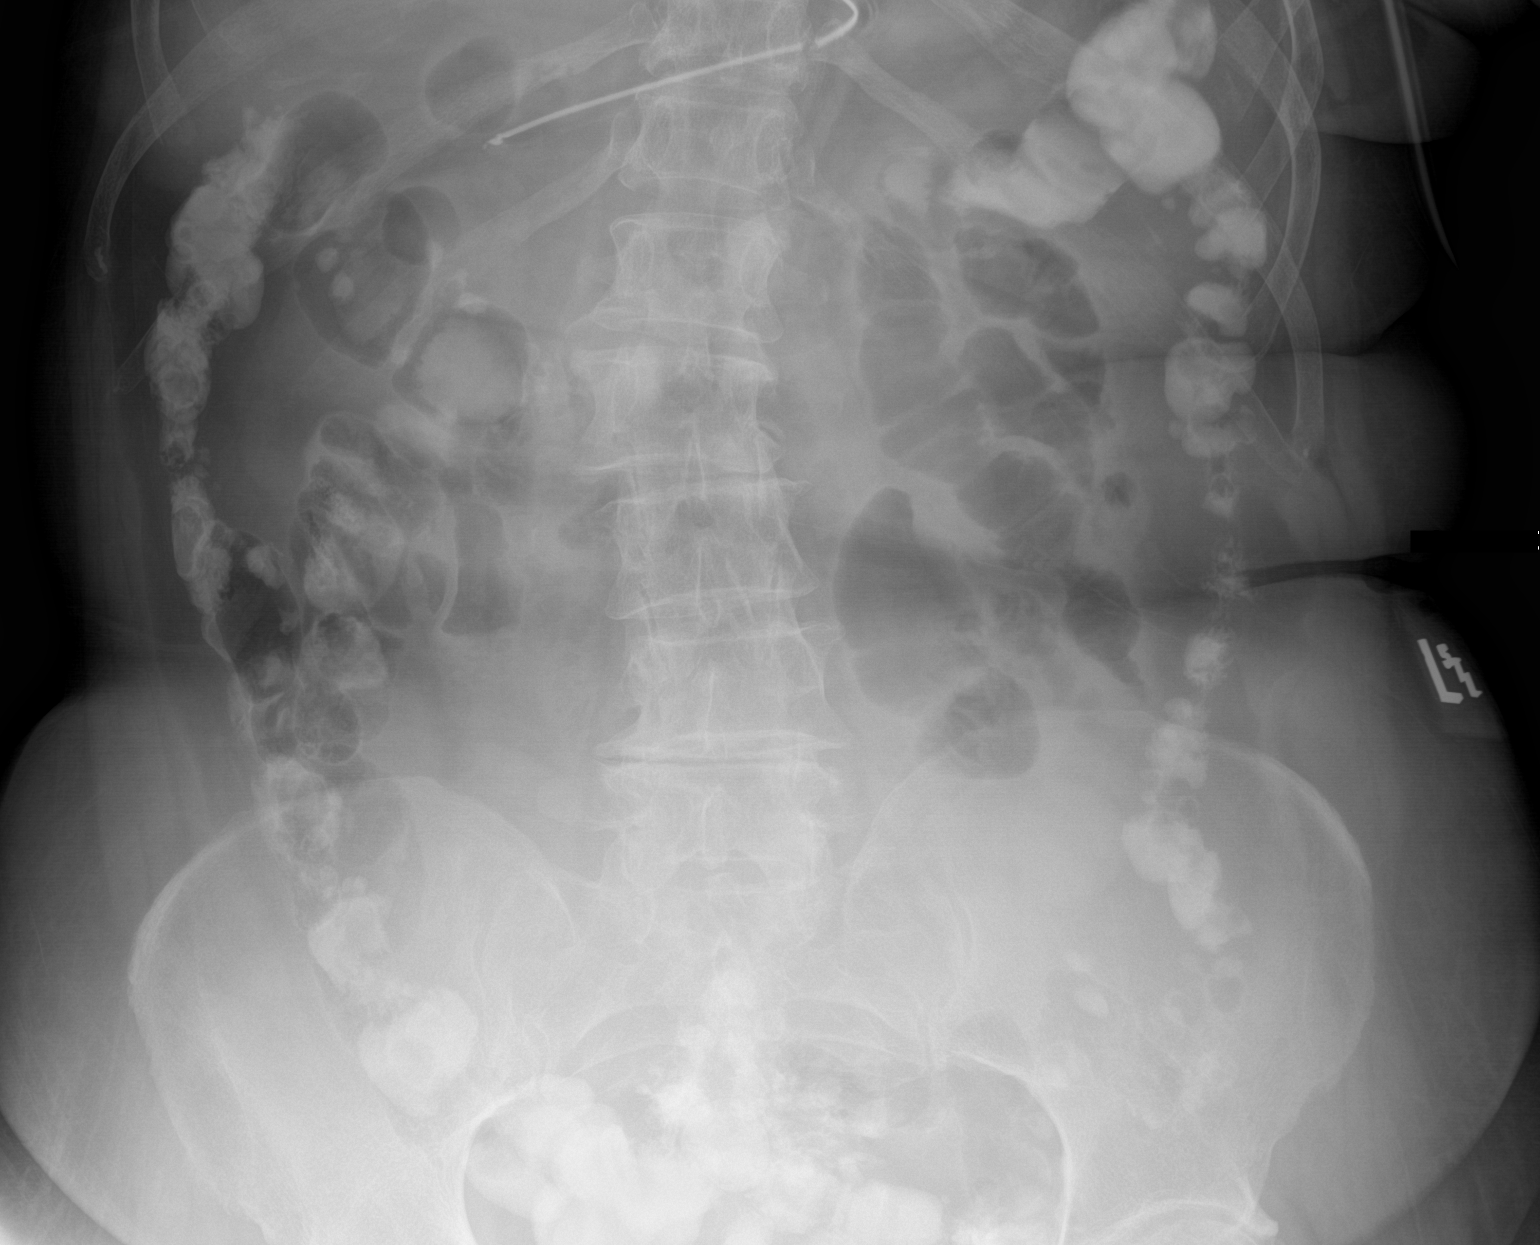

[2 of 2 positions shown; findings below may reference images not displayed]

FINDINGS: Esophageal tube tip projects over the distal stomach. Air filled
slightly distended small bowel in the left abdomen with loops
measuring up to 3.5 cm, overall decreased number of air-filled
distended small bowel loops compared to prior. Enteral contrast is
present within the colon and rectum
IMPRESSION: Enteral contrast is present within the colon and rectum. Decreased
number of air-filled distended small bowel loops compared to prior.

## 2023-05-15 ENCOUNTER — Ambulatory Visit (HOSPITAL_BASED_OUTPATIENT_CLINIC_OR_DEPARTMENT_OTHER)
Admission: RE | Admit: 2023-05-15 | Discharge: 2023-05-15 | Disposition: A | Discharge status: Home / Self Care | Source: Ambulatory Visit | Attending: Pain Medicine | Admitting: Pain Medicine

## 2023-05-15 ENCOUNTER — Other Ambulatory Visit (HOSPITAL_BASED_OUTPATIENT_CLINIC_OR_DEPARTMENT_OTHER): Payer: Self-pay | Admitting: Pain Medicine

## 2023-05-15 DIAGNOSIS — M25551 Pain in right hip: Secondary | ICD-10-CM | POA: Insufficient documentation

## 2023-06-20 ENCOUNTER — Encounter (HOSPITAL_BASED_OUTPATIENT_CLINIC_OR_DEPARTMENT_OTHER): Payer: Self-pay | Admitting: Pain Medicine

## 2023-06-20 ENCOUNTER — Other Ambulatory Visit (HOSPITAL_BASED_OUTPATIENT_CLINIC_OR_DEPARTMENT_OTHER): Payer: Self-pay | Admitting: Pain Medicine

## 2023-06-20 DIAGNOSIS — M25551 Pain in right hip: Secondary | ICD-10-CM

## 2023-06-23 ENCOUNTER — Ambulatory Visit (HOSPITAL_BASED_OUTPATIENT_CLINIC_OR_DEPARTMENT_OTHER)
Admission: RE | Admit: 2023-06-23 | Discharge: 2023-06-23 | Disposition: A | Discharge status: Home / Self Care | Source: Ambulatory Visit | Attending: Pain Medicine | Admitting: Pain Medicine

## 2023-06-23 DIAGNOSIS — M25551 Pain in right hip: Secondary | ICD-10-CM | POA: Insufficient documentation
